# Patient Record
Sex: Male | Born: 1960 | Race: Black or African American | Hispanic: No | Smoking: Current every day smoker
Health system: Southern US, Community
[De-identification: ages and names within clinical notes are randomized; demographics above are authoritative.]

## PROBLEM LIST (undated history)

## (undated) DIAGNOSIS — H9190 Unspecified hearing loss, unspecified ear: Secondary | ICD-10-CM

## (undated) DIAGNOSIS — N200 Calculus of kidney: Secondary | ICD-10-CM

## (undated) DIAGNOSIS — M5430 Sciatica, unspecified side: Secondary | ICD-10-CM

## (undated) DIAGNOSIS — G8929 Other chronic pain: Secondary | ICD-10-CM

## (undated) DIAGNOSIS — J302 Other seasonal allergic rhinitis: Secondary | ICD-10-CM

## (undated) DIAGNOSIS — L309 Dermatitis, unspecified: Secondary | ICD-10-CM

## (undated) DIAGNOSIS — J329 Chronic sinusitis, unspecified: Secondary | ICD-10-CM

## (undated) DIAGNOSIS — Z720 Tobacco use: Secondary | ICD-10-CM

## (undated) DIAGNOSIS — M1990 Unspecified osteoarthritis, unspecified site: Secondary | ICD-10-CM

## (undated) DIAGNOSIS — I10 Essential (primary) hypertension: Secondary | ICD-10-CM

## (undated) DIAGNOSIS — M549 Dorsalgia, unspecified: Secondary | ICD-10-CM

## (undated) DIAGNOSIS — F329 Major depressive disorder, single episode, unspecified: Secondary | ICD-10-CM

## (undated) HISTORY — DX: Unspecified hearing loss, unspecified ear: H91.90

## (undated) HISTORY — PX: INCISION AND DRAINAGE ABSCESS: SHX5864

## (undated) HISTORY — DX: Dermatitis, unspecified: L30.9

## (undated) HISTORY — PX: KIDNEY STONE SURGERY: SHX686

## (undated) HISTORY — DX: Major depressive disorder, single episode, unspecified: F32.9

---

## 1998-08-06 ENCOUNTER — Emergency Department (HOSPITAL_COMMUNITY): Admission: EM | Admit: 1998-08-06 | Discharge: 1998-08-06 | Payer: Self-pay | Admitting: Emergency Medicine

## 1999-02-01 ENCOUNTER — Emergency Department (HOSPITAL_COMMUNITY): Admission: EM | Admit: 1999-02-01 | Discharge: 1999-02-01 | Payer: Self-pay | Admitting: Emergency Medicine

## 1999-11-16 ENCOUNTER — Emergency Department (HOSPITAL_COMMUNITY): Admission: EM | Admit: 1999-11-16 | Discharge: 1999-11-16 | Payer: Self-pay | Admitting: Emergency Medicine

## 1999-12-02 ENCOUNTER — Ambulatory Visit (HOSPITAL_COMMUNITY): Admission: RE | Admit: 1999-12-02 | Discharge: 1999-12-02 | Payer: Self-pay | Admitting: Gastroenterology

## 2000-02-02 ENCOUNTER — Emergency Department (HOSPITAL_COMMUNITY): Admission: EM | Admit: 2000-02-02 | Discharge: 2000-02-02 | Payer: Self-pay | Admitting: Emergency Medicine

## 2000-06-05 ENCOUNTER — Emergency Department (HOSPITAL_COMMUNITY): Admission: EM | Admit: 2000-06-05 | Discharge: 2000-06-05 | Payer: Self-pay | Admitting: Emergency Medicine

## 2000-11-03 ENCOUNTER — Emergency Department (HOSPITAL_COMMUNITY): Admission: EM | Admit: 2000-11-03 | Discharge: 2000-11-03 | Payer: Self-pay | Admitting: *Deleted

## 2001-03-21 ENCOUNTER — Emergency Department (HOSPITAL_COMMUNITY): Admission: EM | Admit: 2001-03-21 | Discharge: 2001-03-21 | Payer: Self-pay | Admitting: Emergency Medicine

## 2001-04-18 ENCOUNTER — Emergency Department (HOSPITAL_COMMUNITY): Admission: EM | Admit: 2001-04-18 | Discharge: 2001-04-18 | Payer: Self-pay | Admitting: Emergency Medicine

## 2001-04-18 ENCOUNTER — Encounter: Payer: Self-pay | Admitting: Emergency Medicine

## 2004-01-02 ENCOUNTER — Emergency Department (HOSPITAL_COMMUNITY): Admission: EM | Admit: 2004-01-02 | Discharge: 2004-01-02 | Payer: Self-pay | Admitting: Emergency Medicine

## 2004-01-18 ENCOUNTER — Emergency Department (HOSPITAL_COMMUNITY): Admission: EM | Admit: 2004-01-18 | Discharge: 2004-01-18 | Payer: Self-pay | Admitting: Emergency Medicine

## 2004-04-08 ENCOUNTER — Emergency Department (HOSPITAL_COMMUNITY): Admission: EM | Admit: 2004-04-08 | Discharge: 2004-04-08 | Payer: Self-pay | Admitting: Emergency Medicine

## 2004-07-27 ENCOUNTER — Emergency Department (HOSPITAL_COMMUNITY): Admission: EM | Admit: 2004-07-27 | Discharge: 2004-07-27 | Payer: Self-pay | Admitting: *Deleted

## 2004-10-16 ENCOUNTER — Emergency Department (HOSPITAL_COMMUNITY): Admission: EM | Admit: 2004-10-16 | Discharge: 2004-10-16 | Payer: Self-pay | Admitting: *Deleted

## 2005-03-27 ENCOUNTER — Emergency Department (HOSPITAL_COMMUNITY): Admission: EM | Admit: 2005-03-27 | Discharge: 2005-03-27 | Payer: Self-pay | Admitting: Emergency Medicine

## 2005-03-29 ENCOUNTER — Emergency Department (HOSPITAL_COMMUNITY): Admission: EM | Admit: 2005-03-29 | Discharge: 2005-03-29 | Payer: Self-pay | Admitting: Family Medicine

## 2005-04-21 ENCOUNTER — Emergency Department (HOSPITAL_COMMUNITY): Admission: EM | Admit: 2005-04-21 | Discharge: 2005-04-21 | Payer: Self-pay | Admitting: *Deleted

## 2005-08-15 ENCOUNTER — Emergency Department (HOSPITAL_COMMUNITY): Admission: EM | Admit: 2005-08-15 | Discharge: 2005-08-15 | Payer: Self-pay | Admitting: *Deleted

## 2005-12-01 ENCOUNTER — Emergency Department (HOSPITAL_COMMUNITY): Admission: EM | Admit: 2005-12-01 | Discharge: 2005-12-01 | Payer: Self-pay | Admitting: Emergency Medicine

## 2005-12-03 ENCOUNTER — Emergency Department (HOSPITAL_COMMUNITY): Admission: EM | Admit: 2005-12-03 | Discharge: 2005-12-03 | Payer: Self-pay | Admitting: Emergency Medicine

## 2006-04-12 ENCOUNTER — Emergency Department (HOSPITAL_COMMUNITY): Admission: EM | Admit: 2006-04-12 | Discharge: 2006-04-12 | Payer: Self-pay | Admitting: Emergency Medicine

## 2006-05-27 ENCOUNTER — Emergency Department (HOSPITAL_COMMUNITY): Admission: EM | Admit: 2006-05-27 | Discharge: 2006-05-27 | Payer: Self-pay | Admitting: Emergency Medicine

## 2006-05-31 ENCOUNTER — Emergency Department (HOSPITAL_COMMUNITY): Admission: EM | Admit: 2006-05-31 | Discharge: 2006-05-31 | Payer: Self-pay | Admitting: Emergency Medicine

## 2007-01-11 ENCOUNTER — Emergency Department (HOSPITAL_COMMUNITY): Admission: EM | Admit: 2007-01-11 | Discharge: 2007-01-11 | Payer: Self-pay | Admitting: Emergency Medicine

## 2007-05-17 ENCOUNTER — Emergency Department (HOSPITAL_COMMUNITY): Admission: EM | Admit: 2007-05-17 | Discharge: 2007-05-17 | Payer: Self-pay | Admitting: Emergency Medicine

## 2007-07-30 ENCOUNTER — Emergency Department (HOSPITAL_COMMUNITY): Admission: EM | Admit: 2007-07-30 | Discharge: 2007-07-30 | Payer: Self-pay | Admitting: *Deleted

## 2008-02-11 ENCOUNTER — Emergency Department (HOSPITAL_COMMUNITY): Admission: EM | Admit: 2008-02-11 | Discharge: 2008-02-11 | Payer: Self-pay | Admitting: Emergency Medicine

## 2008-03-31 ENCOUNTER — Emergency Department (HOSPITAL_COMMUNITY): Admission: EM | Admit: 2008-03-31 | Discharge: 2008-03-31 | Payer: Self-pay | Admitting: Emergency Medicine

## 2008-05-29 IMAGING — CT CT PELVIS W/O CM
2 of 3 series · 17 of 46 positions shown, 19 images · non-contrast
Comparison: Abdominal CT 02/11/2008 done with contrast

CT ABDOMEN

CLINICAL DATA: Postprandial abdominal pain for 1 year, worsening
today.  Rectal bleeding.  Hematuria.  Question kidney stone.

CT ABDOMEN AND PELVIS WITHOUT CONTRAST
TECHNIQUE: Multidetector CT imaging of the abdomen and pelvis was
performed following the standard
protocol without intravenous contrast.

[Series 2: 130 stone 5.0 b40f st · axial · 0.67mm/px · z∈[-485,-185]mm · 14 of 70 slices shown, 16 images]
[im 5/70  soft-tissue]
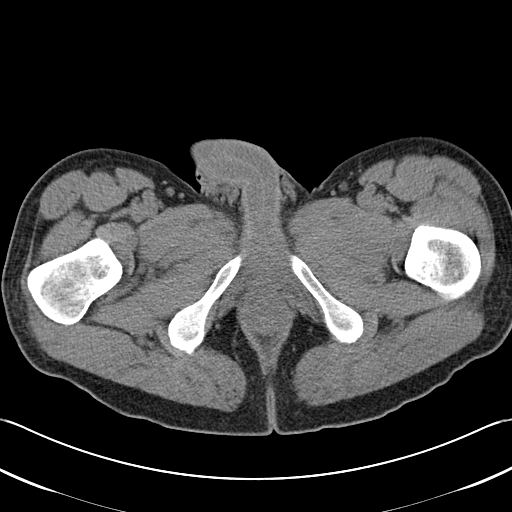
[im 5/70  bone]
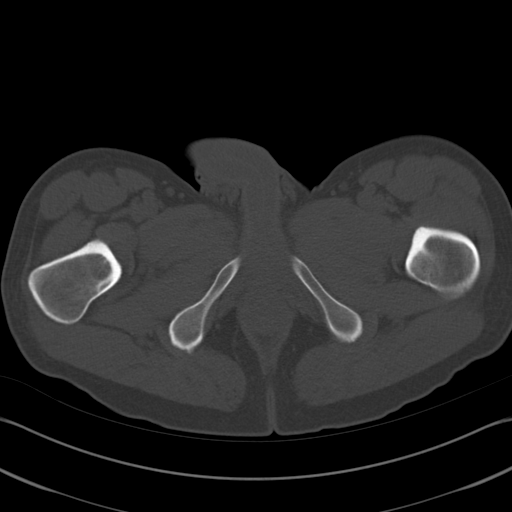
[im 9/70  soft-tissue]
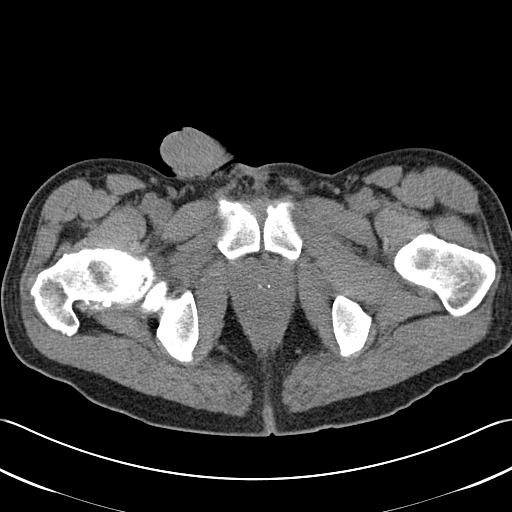
[im 14/70  soft-tissue]
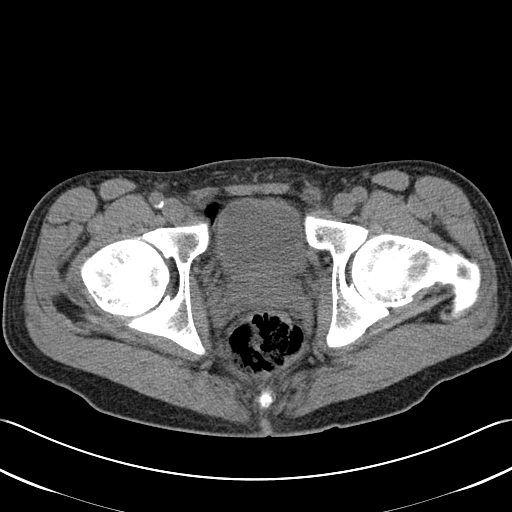
[im 18/70  soft-tissue]
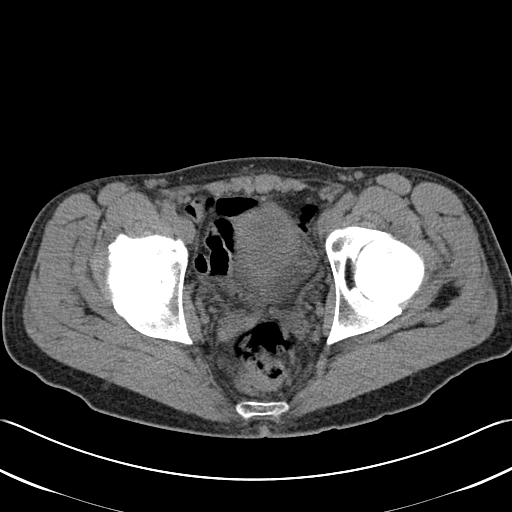
[im 23/70  soft-tissue]
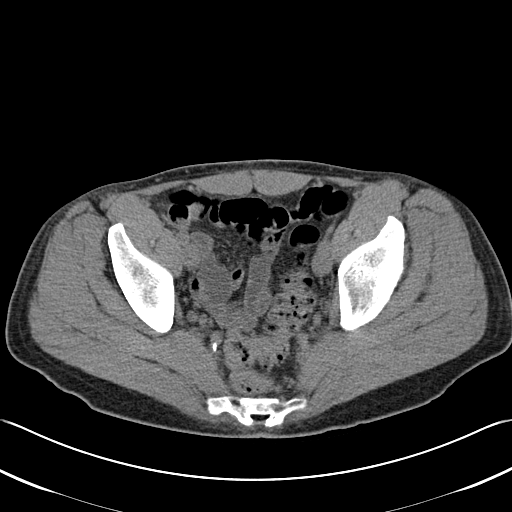
[im 27/70  soft-tissue]
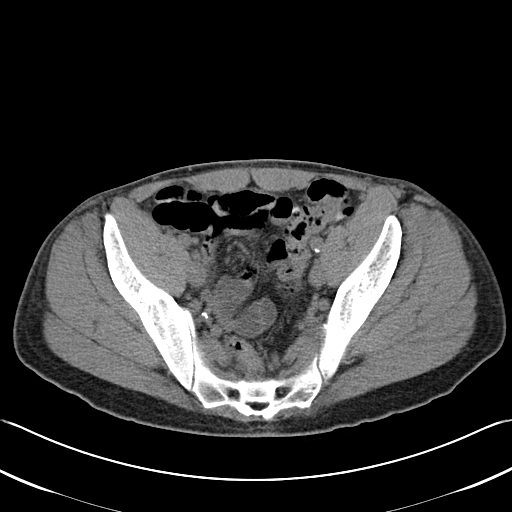
[im 32/70  soft-tissue]
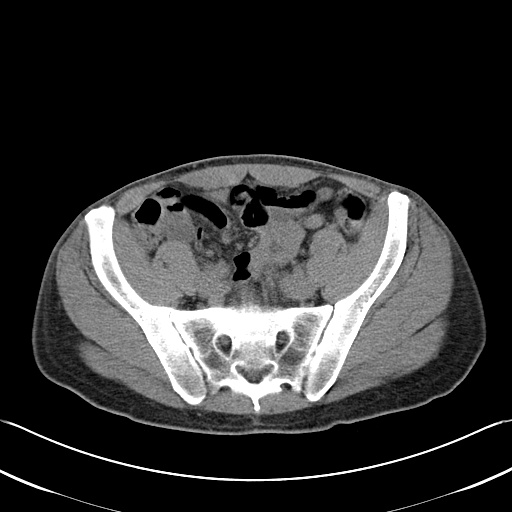
[im 38/70  soft-tissue]
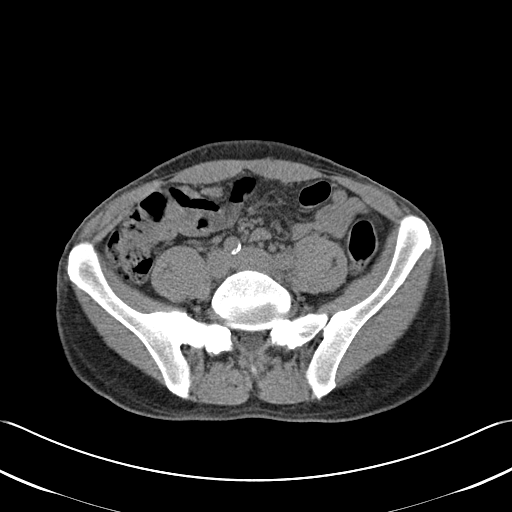
[im 43/70  soft-tissue]
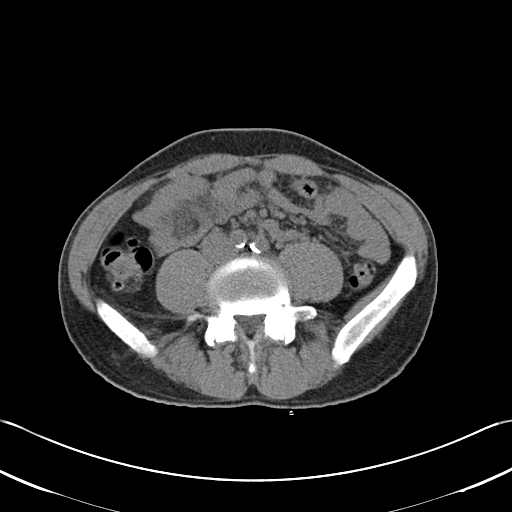
[im 43/70  bone]
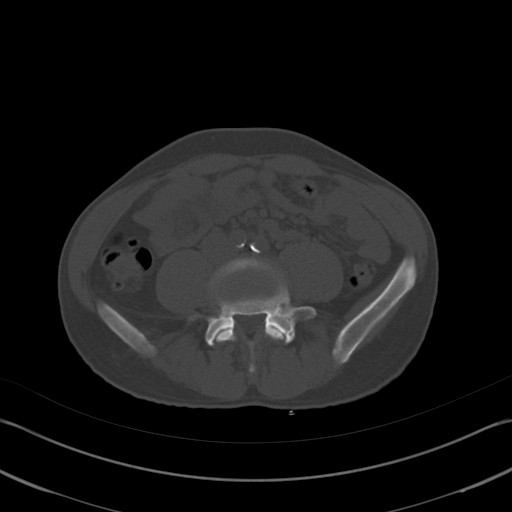
[im 47/70  soft-tissue]
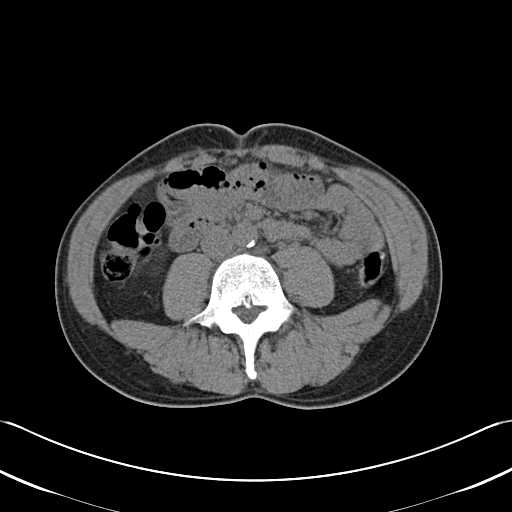
[im 52/70  soft-tissue]
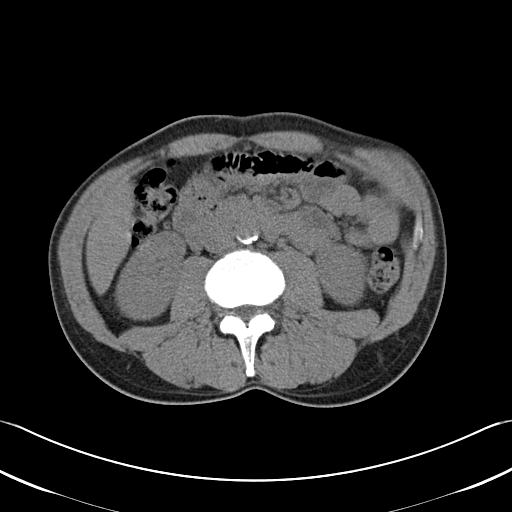
[im 56/70  soft-tissue]
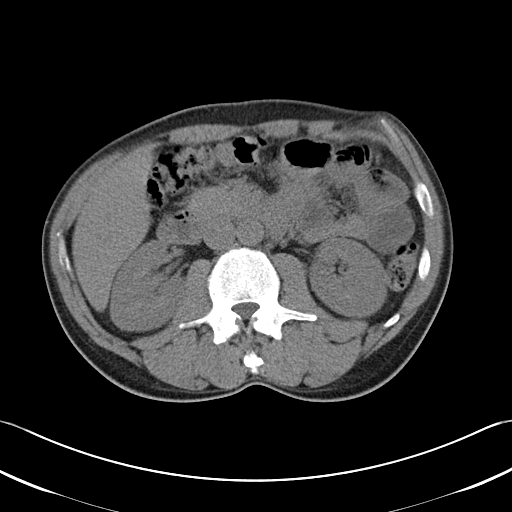
[im 61/70  soft-tissue]
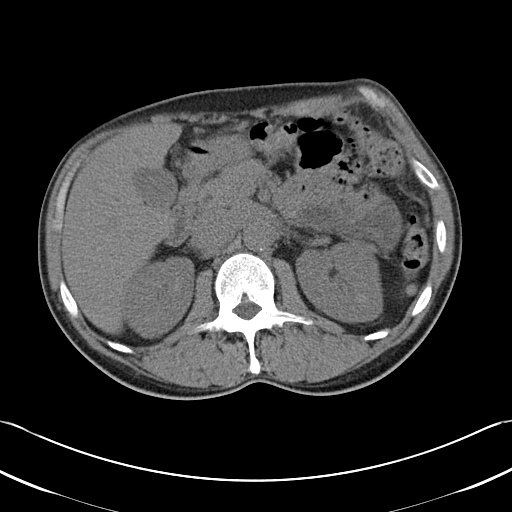
[im 65/70  soft-tissue]
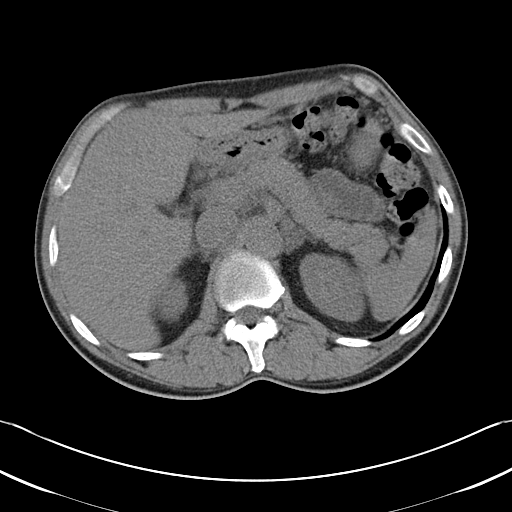

[Series 602: coronal images · coronal · 0.71mm/px · 3 of 82 slices shown]
[im 28/82  soft-tissue]
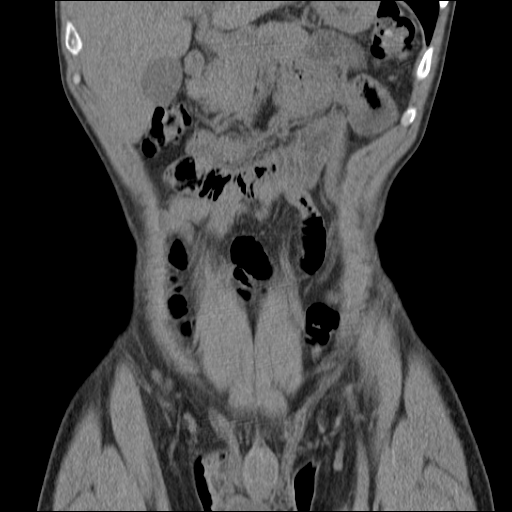
[im 37/82  soft-tissue]
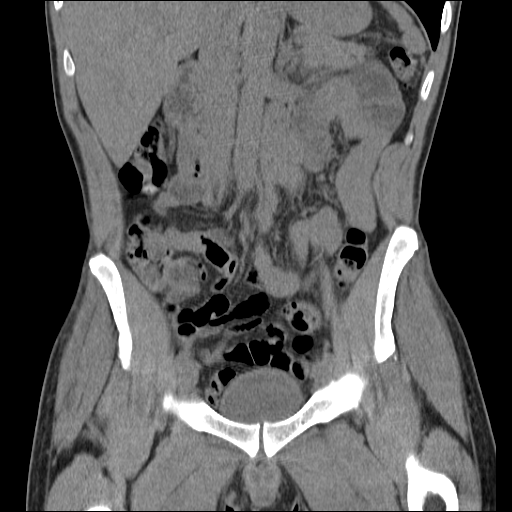
[im 46/82  soft-tissue]
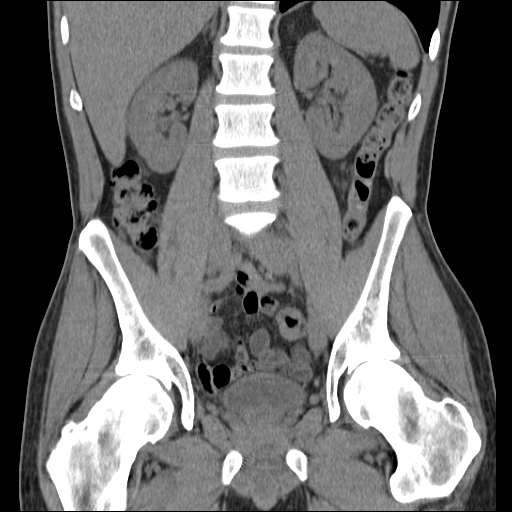

[17 of 46 positions shown; findings below may reference images not displayed]

FINDINGS: As imaged in the unenhanced state, both kidneys appear
normal without calculi, hydronephrosis or perinephric soft tissue
stranding.  No focal renal abnormalities are seen.

Mild low density prominence of the left adrenal gland appears
stable, possibly due to a small adenoma.  The right adrenal gland,
gallbladder and pancreas appear unremarkable.  The visualized liver
and spleen are unchanged from the prior study.  There are small
liver lesions as noted previously.

The bowel gas pattern appears normal.  No inflammatory changes are
evident.  There are early vascular calcifications.
IMPRESSION: 1.  No evidence of urinary tract calculus or hydronephrosis.
2.  No acute abdominal findings are demonstrated.

CT PELVIS
FINDINGS: Distally, the ureters are normal in caliber.  There is
no evidence of ureteral or bladder calculus.  Prostatic
calcifications are noted.  There is a normal caliber air-filled
appendix without surrounding inflammatory change. Sigmoid colon
diverticular changes are present without evidence of associated
inflammation. The sacroiliac joints appear stable.
IMPRESSION: 1.  No evidence of urinary tract calculus or hydronephrosis.
2.  No pelvic inflammatory process evident.

## 2008-06-27 ENCOUNTER — Emergency Department (HOSPITAL_COMMUNITY): Admission: EM | Admit: 2008-06-27 | Discharge: 2008-06-27 | Payer: Self-pay | Admitting: Emergency Medicine

## 2008-07-09 ENCOUNTER — Ambulatory Visit: Payer: Self-pay | Admitting: *Deleted

## 2008-07-09 ENCOUNTER — Ambulatory Visit: Payer: Self-pay | Admitting: Internal Medicine

## 2008-07-09 ENCOUNTER — Encounter (INDEPENDENT_AMBULATORY_CARE_PROVIDER_SITE_OTHER): Payer: Self-pay | Admitting: Family Medicine

## 2008-07-09 LAB — CONVERTED CEMR LAB
ALT: 13 units/L (ref 0–53)
Albumin: 4.8 g/dL (ref 3.5–5.2)
Amphetamine Screen, Ur: NEGATIVE
Basophils Absolute: 0 10*3/uL (ref 0.0–0.1)
Benzodiazepines.: NEGATIVE
CO2: 24 meq/L (ref 19–32)
Chloride: 105 meq/L (ref 96–112)
Cholesterol: 180 mg/dL (ref 0–200)
Eosinophils Relative: 3 % (ref 0–5)
Glucose, Bld: 77 mg/dL (ref 70–99)
Helicobacter Pylori Antibody-IgG: >8 — ABNORMAL HIGH
Lymphocytes Relative: 44 % (ref 12–46)
Lymphs Abs: 1.7 10*3/uL (ref 0.7–4.0)
Methadone: NEGATIVE
Neutro Abs: 1.8 10*3/uL (ref 1.7–7.7)
Neutrophils Relative %: 46 % (ref 43–77)
Phencyclidine (PCP): NEGATIVE
Platelets: 348 10*3/uL (ref 150–400)
Potassium: 4.9 meq/L (ref 3.5–5.3)
Propoxyphene: NEGATIVE
RDW: 14.1 % (ref 11.5–15.5)
Sodium: 141 meq/L (ref 135–145)
Total Bilirubin: 1.3 mg/dL — ABNORMAL HIGH (ref 0.3–1.2)
Total Protein: 7.9 g/dL (ref 6.0–8.3)
VLDL: 12 mg/dL (ref 0–40)
WBC: 3.8 10*3/uL — ABNORMAL LOW (ref 4.0–10.5)

## 2008-07-15 ENCOUNTER — Ambulatory Visit: Payer: Self-pay | Admitting: Internal Medicine

## 2008-07-25 ENCOUNTER — Telehealth: Payer: Self-pay | Admitting: Gastroenterology

## 2008-08-19 ENCOUNTER — Telehealth: Payer: Self-pay | Admitting: Gastroenterology

## 2008-08-27 ENCOUNTER — Ambulatory Visit: Payer: Self-pay | Admitting: Gastroenterology

## 2008-11-17 ENCOUNTER — Ambulatory Visit: Payer: Self-pay | Admitting: Internal Medicine

## 2009-11-11 ENCOUNTER — Encounter (INDEPENDENT_AMBULATORY_CARE_PROVIDER_SITE_OTHER): Payer: Self-pay | Admitting: Family Medicine

## 2009-11-11 ENCOUNTER — Ambulatory Visit: Payer: Self-pay | Admitting: Internal Medicine

## 2009-11-11 DIAGNOSIS — IMO0002 Reserved for concepts with insufficient information to code with codable children: Secondary | ICD-10-CM

## 2009-11-11 DIAGNOSIS — L259 Unspecified contact dermatitis, unspecified cause: Secondary | ICD-10-CM

## 2009-11-11 DIAGNOSIS — D1039 Benign neoplasm of other parts of mouth: Secondary | ICD-10-CM

## 2009-11-11 LAB — CONVERTED CEMR LAB
ALT: 16 units/L (ref 0–53)
Albumin: 4.1 g/dL (ref 3.5–5.2)
CO2: 25 meq/L (ref 19–32)
Calcium: 8.8 mg/dL (ref 8.4–10.5)
Chloride: 111 meq/L (ref 96–112)
Hemoglobin: 14.8 g/dL (ref 13.0–17.0)
Lymphs Abs: 2.1 10*3/uL (ref 0.7–4.0)
MCV: 94.2 fL (ref 78.0–100.0)
Monocytes Relative: 5 % (ref 3–12)
Neutro Abs: 4.9 10*3/uL (ref 1.7–7.7)
Neutrophils Relative %: 65 % (ref 43–77)
RBC: 4.79 M/uL (ref 4.22–5.81)
Sodium: 143 meq/L (ref 135–145)
Total Protein: 7.2 g/dL (ref 6.0–8.3)
WBC: 7.6 10*3/uL (ref 4.0–10.5)

## 2009-11-12 ENCOUNTER — Encounter: Payer: Self-pay | Admitting: Physician Assistant

## 2009-11-12 ENCOUNTER — Ambulatory Visit (HOSPITAL_COMMUNITY): Admission: RE | Admit: 2009-11-12 | Discharge: 2009-11-12 | Payer: Self-pay | Admitting: Family Medicine

## 2009-11-18 ENCOUNTER — Ambulatory Visit: Payer: Self-pay | Admitting: Internal Medicine

## 2010-01-04 ENCOUNTER — Ambulatory Visit: Payer: Self-pay | Admitting: Internal Medicine

## 2010-01-07 ENCOUNTER — Ambulatory Visit (HOSPITAL_COMMUNITY): Admission: RE | Admit: 2010-01-07 | Discharge: 2010-01-07 | Payer: Self-pay | Admitting: Family Medicine

## 2010-01-27 ENCOUNTER — Encounter (INDEPENDENT_AMBULATORY_CARE_PROVIDER_SITE_OTHER): Payer: Self-pay | Admitting: Adult Health

## 2010-01-27 ENCOUNTER — Ambulatory Visit: Payer: Self-pay | Admitting: Internal Medicine

## 2010-01-27 LAB — CONVERTED CEMR LAB
Marijuana Metabolite: POSITIVE — AB
Methadone: NEGATIVE
Opiate Screen, Urine: NEGATIVE
Propoxyphene: NEGATIVE

## 2010-04-23 ENCOUNTER — Ambulatory Visit: Payer: Self-pay | Admitting: Internal Medicine

## 2010-04-23 ENCOUNTER — Encounter (INDEPENDENT_AMBULATORY_CARE_PROVIDER_SITE_OTHER): Payer: Self-pay | Admitting: Adult Health

## 2010-12-07 NOTE — Assessment & Plan Note (Signed)
Summary: Knots under arm and growth in mouth.   History of Present Illness: Knots on fingers, itching, present for about 3 weeks, very itchy/irritating. No h/o scabies. Currently living in the overnight shelter for the last 2 weeks. Problem is recurring for the patient. Previously diagnosed with allergic dermatitis treated with Solumedrol IM, claritin, and triamcinolone cream.  Knots under arms, painful, present 2.5 weeks, has had in the past treated with I&D and Abx. +Fevers and chills mostly at night for about the whole 2.5 weeks.   Mass in mouth. Bleeding with eating, irritating, present since age of 50, has enlarged steadily since then, more recently it has been waxing and waning in size. Recently has become more troublesome and even throbbing/pain. Gained 10 lbs in the last year.  +Tobacco  Brother died of colon CA at age 65.  Review of Systems General:  Complains of chills, fever, and sweats; denies malaise, weakness, and weight loss. ENT:  Denies difficulty swallowing and nasal congestion. Resp:  Denies chest discomfort, cough, shortness of breath, and wheezing. GI:  Denies abdominal pain, bloody stools, and dark tarry stools.  Physical Exam  General:  Well-developed,well-nourished,in no acute distress; alert,appropriate and cooperative throughout examination Head:  Posterior to mandibular incisors, approx 2cm round mass protruding from gums on R, 2 smaller on L. Hard, round, nonmobile. Normal appearing in color Lungs:  Normal respiratory effort, chest expands symmetrically. Lungs are clear to auscultation, no crackles or wheezes. Heart:  Normal rate and regular rhythm. S1 and S2 normal without gallop, murmur, click, rub or other extra sounds. Abdomen:  soft, non-tender, and normal bowel sounds.   Extremities:  R axilla with 4 round firm tender masses with overriding erythema, +induration, no fluctuance.   Impression & Recommendations:  Problem # 1:  DERMATITIS, ALLERGIC  (ICD-692.9)  Triamcinolone cream 0.1% apply two times a day. loratadine 10mg  daily  His updated medication list for this problem includes:    Triamcinolone Acetonide 0.1 % Crea (Triamcinolone acetonide) .Marland Kitchen... Apply to hands two times a day until begins to clear then once daily until improved. do not use longer than 1 month.    Loratadine 10 Mg Tabs (Loratadine) .Marland Kitchen... 1 by mouth daily  Problem # 2:  ABSCESS, AXILLA, RIGHT (ICD-682.3) Warm compresses.  His updated medication list for this problem includes:    Cleocin 300 Mg Caps (Clindamycin hcl) .Marland Kitchen... 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 Panorex. Dental referral.  Complete Medication List: 1)  Triamcinolone Acetonide 0.1 % Crea (Triamcinolone acetonide) .... Apply to hands two times a day until begins to clear then once daily until improved. do not use longer than 1 month. 2)  Loratadine 10 Mg Tabs (Loratadine) .Marland Kitchen.. 1 by mouth daily 3)  Cleocin 300 Mg Caps (Clindamycin hcl) .Marland Kitchen.. 1 by mouth three times a day Prescriptions: TRIAMCINOLONE ACETONIDE 0.1 % CREA (TRIAMCINOLONE ACETONIDE) apply to hands two times a day until begins to clear then once daily until improved. Do not use longer than 1 month.  #large x 0   Entered and Authorized by:   Syliva Overman MD   Signed by:   Syliva Overman MD on 11/11/2009   Method used:   Print then Give to Patient   RxID:   [id number] CLEOCIN 300 MG CAPS (CLINDAMYCIN HCL) 1 by mouth three times a day  #30 x 0   Entered and Authorized by:   Syliva Overman  MD   Signed by:   Syliva Overman MD on 11/11/2009   Method used:   Print then Give to Patient   RxID:   [id number] LORATADINE 10 MG TABS (LORATADINE) 1 by mouth daily  #30 x 5   Entered and Authorized by:   Syliva Overman MD   Signed by:   Syliva Overman MD on 11/11/2009   Method used:   Print then Give to Patient   RxID:   [id number]

## 2011-07-06 ENCOUNTER — Emergency Department (HOSPITAL_COMMUNITY): Payer: Self-pay

## 2011-07-06 ENCOUNTER — Emergency Department (HOSPITAL_COMMUNITY)
Admission: EM | Admit: 2011-07-06 | Discharge: 2011-07-06 | Disposition: A | Discharge status: Home / Self Care | Payer: Self-pay | Attending: Emergency Medicine | Admitting: Emergency Medicine

## 2011-07-06 DIAGNOSIS — S8990XA Unspecified injury of unspecified lower leg, initial encounter: Secondary | ICD-10-CM | POA: Insufficient documentation

## 2011-07-06 DIAGNOSIS — M79609 Pain in unspecified limb: Secondary | ICD-10-CM | POA: Insufficient documentation

## 2011-07-06 DIAGNOSIS — W010XXA Fall on same level from slipping, tripping and stumbling without subsequent striking against object, initial encounter: Secondary | ICD-10-CM | POA: Insufficient documentation

## 2011-07-06 DIAGNOSIS — S9030XA Contusion of unspecified foot, initial encounter: Secondary | ICD-10-CM | POA: Insufficient documentation

## 2011-07-06 DIAGNOSIS — S99919A Unspecified injury of unspecified ankle, initial encounter: Secondary | ICD-10-CM | POA: Insufficient documentation

## 2011-08-02 LAB — COMPREHENSIVE METABOLIC PANEL
Alkaline Phosphatase: 62
BUN: 12
CO2: 29
Chloride: 108
GFR calc non Af Amer: >60
Glucose, Bld: 95
Potassium: 4.3
Total Bilirubin: 1.1
Total Protein: 7

## 2011-08-02 LAB — URINALYSIS, ROUTINE W REFLEX MICROSCOPIC
Glucose, UA: NEGATIVE
Hgb urine dipstick: NEGATIVE
Protein, ur: NEGATIVE

## 2011-08-02 LAB — OCCULT BLOOD X 1 CARD TO LAB, STOOL: Fecal Occult Bld: NEGATIVE

## 2011-08-02 LAB — DIFFERENTIAL
Basophils Absolute: 0.1
Eosinophils Absolute: 0.2
Lymphocytes Relative: 33
Neutrophils Relative %: 56

## 2011-08-02 LAB — LIPASE, BLOOD: Lipase: 20

## 2011-08-02 LAB — CBC
HCT: 44
Hemoglobin: 14.9
RDW: 13.3

## 2011-08-03 LAB — URINALYSIS, ROUTINE W REFLEX MICROSCOPIC
Leukocytes, UA: NEGATIVE
Protein, ur: NEGATIVE
Urobilinogen, UA: 0.2

## 2011-08-03 LAB — CBC
HCT: 42.6
MCHC: 34.1
MCV: 92.9
Platelets: 314

## 2011-08-03 LAB — POCT I-STAT, CHEM 8
Glucose, Bld: 84
HCT: 46
Hemoglobin: 15.6
Potassium: 4.5
Sodium: 141
TCO2: 28

## 2011-08-03 LAB — DIFFERENTIAL
Basophils Relative: 1
Eosinophils Absolute: 0.1
Eosinophils Relative: 2
Monocytes Relative: 6
Neutrophils Relative %: 61

## 2011-08-03 LAB — URINE MICROSCOPIC-ADD ON

## 2011-08-18 LAB — COMPREHENSIVE METABOLIC PANEL
ALT: 18
AST: 22
Albumin: 4.3
Alkaline Phosphatase: 73
Calcium: 9.9
GFR calc Af Amer: >60
Glucose, Bld: 92
Potassium: 4.6
Sodium: 143
Total Protein: 7.5

## 2011-08-18 LAB — CBC
Hemoglobin: 16
MCHC: 33.8
Platelets: 273
RDW: 13.9

## 2011-08-18 LAB — DIFFERENTIAL
Basophils Relative: 1
Eosinophils Absolute: 0.2
Eosinophils Relative: 2
Lymphs Abs: 2.8
Monocytes Absolute: 0.5
Monocytes Relative: 5

## 2011-08-18 LAB — URINALYSIS, ROUTINE W REFLEX MICROSCOPIC
Glucose, UA: NEGATIVE
Hgb urine dipstick: NEGATIVE
Protein, ur: NEGATIVE
pH: 6

## 2011-08-23 LAB — COMPREHENSIVE METABOLIC PANEL
ALT: 14
AST: 16
Alkaline Phosphatase: 73
GFR calc Af Amer: >60
Glucose, Bld: 82
Potassium: 3.9
Sodium: 140
Total Protein: 7.8

## 2011-08-23 LAB — CBC
Hemoglobin: 15.6
RBC: 4.98
RDW: 13.8

## 2011-08-23 LAB — DIFFERENTIAL
Basophils Relative: 1
Eosinophils Absolute: 0.1
Eosinophils Relative: 2
Lymphs Abs: 2.5
Monocytes Absolute: 0.5
Monocytes Relative: 8
Neutrophils Relative %: 47

## 2012-03-09 ENCOUNTER — Emergency Department (HOSPITAL_COMMUNITY): Payer: No Typology Code available for payment source

## 2012-03-09 ENCOUNTER — Emergency Department (HOSPITAL_COMMUNITY)
Admission: EM | Admit: 2012-03-09 | Discharge: 2012-03-09 | Disposition: A | Discharge status: Home / Self Care | Payer: No Typology Code available for payment source | Attending: Emergency Medicine | Admitting: Emergency Medicine

## 2012-03-09 ENCOUNTER — Encounter (HOSPITAL_COMMUNITY): Payer: Self-pay | Admitting: Emergency Medicine

## 2012-03-09 DIAGNOSIS — M542 Cervicalgia: Secondary | ICD-10-CM | POA: Insufficient documentation

## 2012-03-09 DIAGNOSIS — S39012A Strain of muscle, fascia and tendon of lower back, initial encounter: Secondary | ICD-10-CM

## 2012-03-09 DIAGNOSIS — S161XXA Strain of muscle, fascia and tendon at neck level, initial encounter: Secondary | ICD-10-CM

## 2012-03-09 DIAGNOSIS — IMO0002 Reserved for concepts with insufficient information to code with codable children: Secondary | ICD-10-CM

## 2012-03-09 DIAGNOSIS — S239XXA Sprain of unspecified parts of thorax, initial encounter: Secondary | ICD-10-CM | POA: Insufficient documentation

## 2012-03-09 DIAGNOSIS — M549 Dorsalgia, unspecified: Secondary | ICD-10-CM | POA: Insufficient documentation

## 2012-03-09 DIAGNOSIS — S139XXA Sprain of joints and ligaments of unspecified parts of neck, initial encounter: Secondary | ICD-10-CM | POA: Insufficient documentation

## 2012-03-09 DIAGNOSIS — S335XXA Sprain of ligaments of lumbar spine, initial encounter: Secondary | ICD-10-CM | POA: Insufficient documentation

## 2012-03-09 MED ORDER — HYDROCODONE-ACETAMINOPHEN 5-325 MG PO TABS: 2.0000 | ORAL_TABLET | Freq: Four times a day (QID) | ORAL | Status: AC | PRN

## 2012-03-09 NOTE — ED Notes (Signed)
Went into introduce myself but patient gone to Xray.

## 2012-03-09 NOTE — ED Notes (Signed)
Pt to radiology with tech
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
## 2012-03-09 NOTE — Discharge Instructions (Signed)
SEEK IMMEDIATE MEDICAL ATTENTION IF: New numbness, tingling, weakness, or problem with the use of your arms or legs.  Severe back pain not relieved with medications.  Change in bowel or bladder control.  Increasing pain in any areas of the body (such as chest or abdominal pain).  Shortness of breath, dizziness or fainting.  Nausea (feeling sick to your stomach), vomiting, fever, or sweats.  You have neck pain, possibly from a cervical strain and/or pinched nerve.  SEEK IMMEDIATE MEDICAL ATTENTION IF: You develop difficulties swallowing or breathing.  You have new or worse numbness, weakness, tingling, or movement problems in your arms or legs.  You develop increasing pain which is uncontrolled with medications.  You have change in bowel or bladder function, or other concerns.  RESOURCE GUIDE  Dental Problems  Patients with Medicaid: [facility] [phone] [address]                                           [phone] [address] Phone:  [phone]                                                  Phone:  [phone]  If unable to pay or uninsured, contact:  [facility] or [facility]. to become qualified for the adult dental clinic.  Chronic Pain Problems Contact [facility]  [phone] Patients need to be referred by their primary care doctor.  Insufficient Money for Medicine Contact [employer]:  call "211" or [facility] [phone].  No Primary Care Doctor Call [facility]  [phone] Other agencies that provide inexpensive medical care    [facility]  [phone]    [facility]  [phone]    [facility]  [phone]    [facility]  [phone]    [facility]  [phone]    [facility]  [phone]  Psychological Services [facility]  [phone] [facility]  [phone] [facility]   [phone] (emergency  services [phone])  Substance Abuse Resources [facility]  [phone] [facility] [phone] [facility] [phone] Floydene Flock [phone] Residential & Outpatient Substance Abuse Program  [phone]  Abuse/Neglect [facility] Child Abuse Hotline 4132306629 [facility] Child Abuse Hotline 7163097692 (After Hours)  Emergency Shelter [facility] [phone]  [facility] [facility] [phone] [facility] [phone]  MRSA Hotline #:   6171287145    [facility] Resources  [facility] [place]     [employer]                          [facility] Dept. [address] [place]                       [address]      [address]  [address]  Cristobal Goldmann Phone:  [phone]                                   Phone:  [phone]                 Phone:  [phone]  [facility] Phone:  [phone]  [facility] Child Abuse Hotline [phone] (After Hours)
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
## 2012-08-30 ENCOUNTER — Encounter (HOSPITAL_COMMUNITY): Payer: Self-pay | Admitting: *Deleted

## 2012-08-30 ENCOUNTER — Emergency Department (HOSPITAL_COMMUNITY)
Admission: EM | Admit: 2012-08-30 | Discharge: 2012-08-30 | Disposition: A | Discharge status: Home Health | Payer: Self-pay | Attending: Emergency Medicine | Admitting: Emergency Medicine

## 2012-08-30 ENCOUNTER — Emergency Department (HOSPITAL_COMMUNITY): Payer: Self-pay

## 2012-08-30 DIAGNOSIS — J9801 Acute bronchospasm: Secondary | ICD-10-CM | POA: Insufficient documentation

## 2012-08-30 DIAGNOSIS — F17200 Nicotine dependence, unspecified, uncomplicated: Secondary | ICD-10-CM | POA: Insufficient documentation

## 2012-08-30 DIAGNOSIS — J209 Acute bronchitis, unspecified: Secondary | ICD-10-CM | POA: Insufficient documentation

## 2012-08-30 DIAGNOSIS — Z720 Tobacco use: Secondary | ICD-10-CM

## 2012-08-30 DIAGNOSIS — M79609 Pain in unspecified limb: Secondary | ICD-10-CM | POA: Insufficient documentation

## 2012-08-30 HISTORY — DX: Other seasonal allergic rhinitis: J30.2

## 2012-08-30 MED ORDER — BENZONATATE 100 MG PO CAPS: 200.0000 mg | ORAL_CAPSULE | Freq: Two times a day (BID) | ORAL | Status: DC | PRN

## 2012-08-30 MED ORDER — PREDNISONE 20 MG PO TABS: 40.0000 mg | ORAL_TABLET | Freq: Every day | ORAL | Status: DC

## 2012-08-30 MED ORDER — ALBUTEROL SULFATE HFA 108 (90 BASE) MCG/ACT IN AERS
2.0000 | INHALATION_SPRAY | Freq: Once | RESPIRATORY_TRACT | Status: AC
  Administered 2012-08-30: 2 via RESPIRATORY_TRACT
  Filled 2012-08-30: qty 6.7

## 2012-08-30 NOTE — ED Notes (Signed)
Patient states cold symptoms, (cough, weakness) x 1 week, patient also with c/o arms and legs tingling and poor appetite

## 2012-08-30 NOTE — ED Provider Notes (Signed)
History     CSN: [id number]  Arrival date & time 08/30/12  3086   First MD Initiated Contact with Patient 08/30/12 0818      Chief Complaint  Patient presents with  . Extremity Pain    arms/legs  . URI    (Consider location/radiation/quality/duration/timing/severity/associated sxs/prior treatment) HPI Comments: Mr. Francom presents for evaluation of a persistent cough as well as numbness in his left leg. He reports he's had a numbness in his left leg for over 4 months. He states sometimes it is just the numbness sometimes it's a tingling sensation but it is been fairly constant during this time. He denies one-sided weakness, difficulty forming his words, difficulty swallowing, and headaches. He does state that he has had lower back pain for so long he cannot remember when he didn't have the discomfort. He denies difficulty walking upstairs, urinating, or numbness on his bottom or perineum. He reports that over the last 2 weeks he's developed a cough. He states that sometimes he coughs so violently that it hurts his chest and will cause him to vomit. He states is productive of thick sputum. He denies however having any fevers, night sweats, weight loss, shortness of breath while at rest, orthopnea, or exertional dyspnea. He denies any leg swelling or calf pain also and has had no recent travel.  Patient is a 51 y.o. male presenting with cough. The history is provided by the patient.  Cough This is a new problem. The current episode started more than 1 week ago. The problem occurs every few minutes. The problem has not changed since onset.The cough is productive of sputum. There has been no fever. Associated symptoms include chest pain (Only with cough), rhinorrhea and shortness of breath (only with the cough). Pertinent negatives include no chills, no sweats, no ear congestion, no ear pain, no headaches, no sore throat, no myalgias, no wheezing and no eye redness. He has tried nothing for the  symptoms. He is a smoker.    Past Medical History  Diagnosis Date  . Seasonal allergies     History reviewed. No pertinent past surgical history.  No family history on file.  History  Substance Use Topics  . Smoking status: Current Every Day Smoker  . Smokeless tobacco: Not on file  . Alcohol Use: Yes      Review of Systems  Constitutional: Negative for chills.  HENT: Positive for rhinorrhea. Negative for ear pain and sore throat.   Eyes: Negative for redness.  Respiratory: Positive for cough and shortness of breath (only with the cough). Negative for choking and wheezing.   Cardiovascular: Positive for chest pain (Only with cough).  Musculoskeletal: Positive for back pain and arthralgias. Negative for myalgias and gait problem.  Skin: Negative.   Neurological: Negative for headaches.  Psychiatric/Behavioral: Negative.     Allergies  Review of patient's allergies indicates no known allergies.  Home Medications   Current Outpatient Rx  Name Route Sig Dispense Refill  . ALKA-SELTZER PLUS COLD & COUGH PO Oral Take 1 packet by mouth daily as needed. For cough    . NYQUIL PO Oral Take 2 capsules by mouth daily as needed. For cough      BP 164/99  Temp 97.7 F (36.5 C) (Oral)  Resp 20  SpO2 98%  Physical Exam  Nursing note and vitals reviewed. Constitutional: He is oriented to person, place, and time. He appears well-developed and well-nourished. No distress.  HENT:  Head: Normocephalic and atraumatic.  Right Ear:  External ear normal.  Left Ear: External ear normal.  Nose: Nose normal.  Mouth/Throat: Oropharynx is clear and moist. No oropharyngeal exudate.  Eyes: Conjunctivae normal are normal. Pupils are equal, round, and reactive to light. Right eye exhibits no discharge. Left eye exhibits no discharge. No scleral icterus.  Neck: Normal range of motion. Neck supple. No JVD present. No tracheal deviation present.  Cardiovascular: Normal rate, regular rhythm,  normal heart sounds and intact distal pulses.  Exam reveals no gallop.   No murmur heard. Pulmonary/Chest: Effort normal. No stridor. No respiratory distress (Breath sounds are coarse without increased work of breathing or specific wheezes rales or rhonchi.). He has no wheezes. He has no rales. He exhibits tenderness.  Abdominal: Soft. Bowel sounds are normal. He exhibits no distension and no mass. There is no tenderness. There is no rebound and no guarding.  Musculoskeletal: Normal range of motion. He exhibits tenderness (vague lower back tenderness without midline pointe tenderness). He exhibits no edema.  Lymphadenopathy:    He has no cervical adenopathy.  Neurological: He is alert and oriented to person, place, and time.  Skin: Skin is warm and dry. No rash noted. He is not diaphoretic. No erythema. No pallor.  Psychiatric: He has a normal mood and affect. His behavior is normal.    ED Course  Procedures (including critical care time)  Labs Reviewed - No data to display No results found.   No diagnosis found.    MDM  Pt presents for evaluation of left leg numbness and a productive cough. The leg numbness has been ongoing for greater than 4 months. On followup history he reports having significant chronic lower back pain. He denies saddle anesthesia or difficulty urinating. He has no focal deficits on physical exam. I have encouraged outpatient followup regarding this issue. Over last 2 weeks he is also had a productive cough. He denies having any fever. Currently no stable vital signs. He has no respiratory insufficiency or distress on exam. He is a smoker and I suspect his symptoms are related to an acute or subacute bronchitis. We'll obtain a chest x-ray and administer albuterol. Will reassess.  1030.  Pt stable, NAD.  No evidence of PNA on CXR.  Plan treat for bronchitis.  Will refer for outpt follow-up.        Tobin Chad, MD 08/30/12 1031

## 2013-06-03 ENCOUNTER — Encounter (HOSPITAL_COMMUNITY): Payer: Self-pay | Admitting: Cardiology

## 2013-06-03 ENCOUNTER — Emergency Department (HOSPITAL_COMMUNITY)
Admission: EM | Admit: 2013-06-03 | Discharge: 2013-06-03 | Disposition: A | Discharge status: Home / Self Care | Payer: Self-pay | Attending: Emergency Medicine | Admitting: Emergency Medicine

## 2013-06-03 DIAGNOSIS — K029 Dental caries, unspecified: Secondary | ICD-10-CM | POA: Insufficient documentation

## 2013-06-03 DIAGNOSIS — R6884 Jaw pain: Secondary | ICD-10-CM | POA: Insufficient documentation

## 2013-06-03 DIAGNOSIS — K047 Periapical abscess without sinus: Secondary | ICD-10-CM | POA: Insufficient documentation

## 2013-06-03 DIAGNOSIS — K0889 Other specified disorders of teeth and supporting structures: Secondary | ICD-10-CM

## 2013-06-03 DIAGNOSIS — J309 Allergic rhinitis, unspecified: Secondary | ICD-10-CM | POA: Insufficient documentation

## 2013-06-03 DIAGNOSIS — Z79899 Other long term (current) drug therapy: Secondary | ICD-10-CM | POA: Insufficient documentation

## 2013-06-03 DIAGNOSIS — F17200 Nicotine dependence, unspecified, uncomplicated: Secondary | ICD-10-CM | POA: Insufficient documentation

## 2013-06-03 DIAGNOSIS — I10 Essential (primary) hypertension: Secondary | ICD-10-CM | POA: Insufficient documentation

## 2013-06-03 HISTORY — DX: Essential (primary) hypertension: I10

## 2013-06-03 MED ORDER — AMOXICILLIN 500 MG PO CAPS: 500.0000 mg | ORAL_CAPSULE | Freq: Three times a day (TID) | ORAL | Status: DC

## 2013-06-03 MED ORDER — NAPROXEN 500 MG PO TABS: 500.0000 mg | ORAL_TABLET | Freq: Two times a day (BID) | ORAL | Status: DC

## 2013-06-03 NOTE — ED Notes (Signed)
Pt reports dental pain that started on Friday. States that pain is worse with movement. Reports he has false teeth in the top front.

## 2013-06-03 NOTE — ED Provider Notes (Signed)
CSN: [id number]     Arrival date & time 06/03/13  1255 History     First MD Initiated Contact with Patient 06/03/13 1337     Chief Complaint  Patient presents with  . Dental Pain   (Consider location/radiation/quality/duration/timing/severity/associated sxs/prior Treatment) HPI Comments: 52 year old male presents to the emergency department complaining of dental pain for the past 4 days. Patient states while he was at work on Friday he began having lower jaw pain bilateral, worse on the right. Pain described as throbbing, rated 8/10. He has not tried any alleviating factors, pain worsened with eating. Denies fever, chills, difficulty swallowing. He does not have a dentist.  Patient is a 52 y.o. male presenting with tooth pain. The history is provided by the patient.  Dental Pain Associated symptoms: no facial swelling and no fever     Past Medical History  Diagnosis Date  . Seasonal allergies   . Hypertension    History reviewed. No pertinent past surgical history. History reviewed. No pertinent family history. History  Substance Use Topics  . Smoking status: Current Every Day Smoker    Types: Cigarettes  . Smokeless tobacco: Not on file  . Alcohol Use: Yes    Review of Systems  Constitutional: Negative for fever and chills.  HENT: Positive for dental problem. Negative for facial swelling and trouble swallowing.   All other systems reviewed and are negative.    Allergies  Review of patient's allergies indicates no known allergies.  Home Medications   Current Outpatient Rx  Name  Route  Sig  Dispense  Refill  . amoxicillin (AMOXIL) 500 MG capsule   Oral   Take 1 capsule (500 mg total) by mouth 3 (three) times daily.   21 capsule   0   . benzonatate (TESSALON) 100 MG capsule   Oral   Take 2 capsules (200 mg total) by mouth 2 (two) times daily as needed for cough.   20 capsule   0   . naproxen (NAPROSYN) 500 MG tablet   Oral   Take 1 tablet (500 mg total) by  mouth 2 (two) times daily.   30 tablet   0   . Phenyleph-CPM-DM-Aspirin (ALKA-SELTZER PLUS COLD & COUGH PO)   Oral   Take 1 packet by mouth daily as needed. For cough         . predniSONE (DELTASONE) 20 MG tablet   Oral   Take 2 tablets (40 mg total) by mouth daily. Take 40 mg by mouth daily for 3 days, then 20mg  by mouth daily for 3 days, then 10mg  daily for 3 days   12 tablet   0   . Pseudoeph-Doxylamine-DM-APAP (NYQUIL PO)   Oral   Take 2 capsules by mouth daily as needed. For cough          BP 144/105  Pulse 61  Temp(Src) 97.8 F (36.6 C) (Oral)  Resp 16  SpO2 99% Physical Exam  Nursing note and vitals reviewed. Constitutional: He is oriented to person, place, and time. He appears well-developed and well-nourished. No distress.  HENT:  Head: Normocephalic and atraumatic.  Mouth/Throat: Uvula is midline and oropharynx is clear and moist. Abnormal dentition. Dental caries present.    Poor dentition throughout lower teeth, upper teeth dentures. Multiple caries and missing teeth.  Eyes: Conjunctivae are normal.  Neck: Normal range of motion. Neck supple.  Cardiovascular: Normal rate, regular rhythm and normal heart sounds.   Pulmonary/Chest: Effort normal and breath sounds normal.  Musculoskeletal: Normal  range of motion. He exhibits no edema.  Lymphadenopathy:    He has no cervical adenopathy.  Neurological: He is alert and oriented to person, place, and time.  Skin: Skin is warm and dry. He is not diaphoretic.  Psychiatric: He has a normal mood and affect. His behavior is normal.    ED Course   Procedures (including critical care time)  Labs Reviewed - No data to display No results found. 1. Dental infection   2. Pain, dental     MDM   Dental pain associated with dental infection. No evidence of dental abscess. Patient is afebrile, non toxic appearing and swallowing secretions well. I gave patient referral to dentist and stressed the importance of dental  follow up for ultimate management of dental pain. I will also give amoxicillin and pain control. Patient voices understanding and is agreeable to plan.   Trevor Mace, PA-C 06/03/13 1346

## 2013-06-03 NOTE — ED Provider Notes (Signed)
  Medical screening examination/treatment/procedure(s) were performed by non-physician practitioner and as supervising physician I was immediately available for consultation/collaboration.    Macklyn Glandon, MD 06/03/13 1435 

## 2013-10-21 ENCOUNTER — Emergency Department (HOSPITAL_COMMUNITY)
Admission: EM | Admit: 2013-10-21 | Discharge: 2013-10-21 | Disposition: A | Discharge status: Home / Self Care | Payer: No Typology Code available for payment source | Attending: Emergency Medicine | Admitting: Emergency Medicine

## 2013-10-21 ENCOUNTER — Encounter (HOSPITAL_COMMUNITY): Payer: Self-pay | Admitting: Emergency Medicine

## 2013-10-21 DIAGNOSIS — M25512 Pain in left shoulder: Secondary | ICD-10-CM

## 2013-10-21 DIAGNOSIS — G8929 Other chronic pain: Secondary | ICD-10-CM | POA: Insufficient documentation

## 2013-10-21 DIAGNOSIS — M25519 Pain in unspecified shoulder: Secondary | ICD-10-CM | POA: Insufficient documentation

## 2013-10-21 DIAGNOSIS — R209 Unspecified disturbances of skin sensation: Secondary | ICD-10-CM | POA: Insufficient documentation

## 2013-10-21 DIAGNOSIS — M26629 Arthralgia of temporomandibular joint, unspecified side: Secondary | ICD-10-CM | POA: Insufficient documentation

## 2013-10-21 DIAGNOSIS — M5430 Sciatica, unspecified side: Secondary | ICD-10-CM | POA: Insufficient documentation

## 2013-10-21 DIAGNOSIS — M545 Low back pain, unspecified: Secondary | ICD-10-CM | POA: Insufficient documentation

## 2013-10-21 DIAGNOSIS — K089 Disorder of teeth and supporting structures, unspecified: Secondary | ICD-10-CM | POA: Insufficient documentation

## 2013-10-21 DIAGNOSIS — H9209 Otalgia, unspecified ear: Secondary | ICD-10-CM | POA: Insufficient documentation

## 2013-10-21 DIAGNOSIS — F17200 Nicotine dependence, unspecified, uncomplicated: Secondary | ICD-10-CM | POA: Insufficient documentation

## 2013-10-21 DIAGNOSIS — Z791 Long term (current) use of non-steroidal anti-inflammatories (NSAID): Secondary | ICD-10-CM | POA: Insufficient documentation

## 2013-10-21 DIAGNOSIS — I10 Essential (primary) hypertension: Secondary | ICD-10-CM | POA: Insufficient documentation

## 2013-10-21 HISTORY — DX: Dorsalgia, unspecified: M54.9

## 2013-10-21 HISTORY — DX: Other chronic pain: G89.29

## 2013-10-21 HISTORY — DX: Sciatica, unspecified side: M54.30

## 2013-10-21 MED ORDER — ACETAMINOPHEN 500 MG PO TABS: 1000.0000 mg | ORAL_TABLET | Freq: Three times a day (TID) | ORAL | Status: DC | PRN

## 2013-10-21 MED ORDER — NAPROXEN 500 MG PO TABS: 500.0000 mg | ORAL_TABLET | Freq: Two times a day (BID) | ORAL | Status: DC

## 2013-10-21 MED ORDER — HYDROCODONE-ACETAMINOPHEN 5-325 MG PO TABS
2.0000 | ORAL_TABLET | Freq: Once | ORAL | Status: AC
  Administered 2013-10-21: 2 via ORAL
  Filled 2013-10-21: qty 2

## 2013-10-21 MED ORDER — NAPROXEN 250 MG PO TABS
500.0000 mg | ORAL_TABLET | Freq: Once | ORAL | Status: AC
  Administered 2013-10-21: 500 mg via ORAL
  Filled 2013-10-21: qty 2

## 2013-10-21 NOTE — ED Notes (Signed)
Pt c/o bilateral jaw pain x1 wk and left arm and left leg numbness and pain. Pt states the pain in his arm has been there for a week, the leg pain has been longer. Pt rates pain 10/10. Pt has HTN, but has not been taking medications x 3mos.

## 2013-10-21 NOTE — ED Notes (Signed)
Pt denies any cp 

## 2013-10-21 NOTE — ED Provider Notes (Signed)
CSN: [id number]     Arrival date & time 10/21/13  1146 History   First MD Initiated Contact with Patient 10/21/13 1215     Chief Complaint  Patient presents with  . Dental Pain  . Arm Pain   (Consider location/radiation/quality/duration/timing/severity/associated sxs/prior Treatment) HPI Comments: 52 yo male with hx of chronic back pain with sciatica and recurrent dental pain presents with c/o left sided lower back pain radiating to the left leg, left shoulder pain that radiates down the arm, as well as pain to the bilateral TMJ x 2 weeks.   Back/leg pain worse with certain positions and improved transiently with OTC pain meds. Denies urinary/bowel incontinence, abd pain, weakness. Endorses baseline left leg intermittent numbness/tingling.  Jaw pain has been present for about 2 weeks, worse with exposure to cold air (reports cool air from vent blows on him at work), not improved by anything and accompanied by right ear pain. Denies rhinorrhea, sore throat, dental abscess, fever, chills.  Left shoulder pain is intermittent, no aggravating nor alleviating factors. Denies numbness/tingling, weakness of upper extremity.  The history is provided by the patient.    Past Medical History  Diagnosis Date  . Seasonal allergies   . Hypertension   . Chronic back pain   . Sciatica    History reviewed. No pertinent past surgical history. No family history on file. History  Substance Use Topics  . Smoking status: Current Every Day Smoker    Types: Cigarettes  . Smokeless tobacco: Not on file  . Alcohol Use: Yes    Review of Systems  Constitutional: Negative for fever.  HENT: Positive for dental problem and ear pain. Negative for facial swelling, rhinorrhea and sore throat.   Respiratory: Negative for cough and shortness of breath.   Cardiovascular: Negative for chest pain, palpitations and leg swelling.  Gastrointestinal: Negative for abdominal pain.  Genitourinary: Negative for  difficulty urinating.  Musculoskeletal: Positive for back pain. Negative for gait problem.  Skin: Negative for rash.  Neurological: Positive for numbness. Negative for weakness.  Hematological: Negative for adenopathy.  Psychiatric/Behavioral: Negative for agitation.    Allergies  Review of patient's allergies indicates no known allergies.  Home Medications   Current Outpatient Rx  Name  Route  Sig  Dispense  Refill  . acetaminophen (TYLENOL) 500 MG tablet   Oral   Take 2 tablets (1,000 mg total) by mouth every 8 (eight) hours as needed for mild pain.   30 tablet   0   . naproxen (NAPROSYN) 500 MG tablet   Oral   Take 1 tablet (500 mg total) by mouth 2 (two) times daily.   30 tablet   0   . naproxen (NAPROSYN) 500 MG tablet   Oral   Take 1 tablet (500 mg total) by mouth 2 (two) times daily.   30 tablet   0    BP 165/110  Pulse 83  Temp(Src) 98 F (36.7 C)  Resp 10  SpO2 99% Physical Exam  Nursing note and vitals reviewed. Constitutional: He is oriented to person, place, and time. He appears well-developed and well-nourished.  HENT:  Head: Normocephalic and atraumatic.  Right Ear: Tympanic membrane, external ear and ear canal normal.  Left Ear: Tympanic membrane, external ear and ear canal normal.  Nose: Nose normal.  Mouth/Throat: Oropharynx is clear and moist and mucous membranes are normal. No trismus in the jaw. Abnormal dentition. No dental abscesses.    No evidence of fluid collection/abscess. Generalized moderate to severe  gingivitis and mild abnormal dentition. Mild tenderness bilateral TMJ, FROM of mandible, no popping/clicking.  Eyes: EOM are normal.  Neck: Normal range of motion and full passive range of motion without pain. Neck supple. No spinous process tenderness and no muscular tenderness present.  Cardiovascular: Normal rate, regular rhythm, normal heart sounds and intact distal pulses.   Pulmonary/Chest: Effort normal and breath sounds normal.  He exhibits no tenderness.  Abdominal: Soft. There is no tenderness.  Musculoskeletal:       Left shoulder: He exhibits tenderness ( anterior bursa) and pain. He exhibits normal range of motion, no bony tenderness, no swelling, no effusion, no crepitus, no deformity, no spasm, normal pulse and normal strength.       Lumbar back: He exhibits decreased range of motion ( slight), tenderness and pain. He exhibits no bony tenderness, no swelling, no edema, no deformity, no spasm and normal pulse.       Back:  Neurological: He is alert and oriented to person, place, and time. He has normal strength and normal reflexes. No sensory deficit.  Skin: Skin is warm and dry.  Psychiatric: He has a normal mood and affect.    ED Course  Procedures (including critical care time) Labs Review Labs Reviewed - No data to display Imaging Review No results found.  EKG Interpretation   None       MDM   1. Low back pain with sciatica, left   2. TMJ arthralgia   3. Left shoulder pain    52 yo male with multiple complaints. Neuro exam reveals no deficits, no report of bowel/bladder incontinence, no evidence to suggest cauda equina, no emergent spine imaging indicated--likely exacerbation of chronic low back pain with sciatica. Left shoulder with FROM, no bony TTP and no evidence of infection. Neurovascularly intact distally--doubt septic joint, no imaging indicated. No evidence of ear infection. 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 Will recommend NSAIDs and pain control and f/u with a PCP. Strict return instructions discussed and provided to the patient in writing at time of d/c.       Simmie Davies, NP 10/21/13 1259

## 2013-10-21 NOTE — ED Notes (Signed)
Pt states he was taking the bus home. Discharge instructions reviewed with pt. Pt verbalized understanding.

## 2013-10-21 NOTE — ED Notes (Addendum)
Bilateral jaw pain and left arm pain x 1 week has been nauseated has not taken bp meds x 2 months did not think he needed it

## 2013-10-23 NOTE — ED Provider Notes (Signed)
Medical screening examination/treatment/procedure(s) were performed by non-physician practitioner and as supervising physician I was immediately available for consultation/collaboration.  EKG Interpretation   None         Laray Anger, DO 10/23/13 [phone]

## 2013-11-07 DIAGNOSIS — I10 Essential (primary) hypertension: Secondary | ICD-10-CM

## 2013-11-07 HISTORY — DX: Essential (primary) hypertension: I10

## 2014-01-01 ENCOUNTER — Emergency Department (HOSPITAL_COMMUNITY)
Admission: EM | Admit: 2014-01-01 | Discharge: 2014-01-01 | Disposition: A | Discharge status: Home / Self Care | Payer: Self-pay | Attending: Emergency Medicine | Admitting: Emergency Medicine

## 2014-01-01 ENCOUNTER — Emergency Department (HOSPITAL_COMMUNITY): Payer: No Typology Code available for payment source

## 2014-01-01 ENCOUNTER — Encounter (HOSPITAL_COMMUNITY): Payer: Self-pay | Admitting: Emergency Medicine

## 2014-01-01 DIAGNOSIS — M5430 Sciatica, unspecified side: Secondary | ICD-10-CM | POA: Insufficient documentation

## 2014-01-01 DIAGNOSIS — I10 Essential (primary) hypertension: Secondary | ICD-10-CM | POA: Insufficient documentation

## 2014-01-01 DIAGNOSIS — F17200 Nicotine dependence, unspecified, uncomplicated: Secondary | ICD-10-CM | POA: Insufficient documentation

## 2014-01-01 DIAGNOSIS — R079 Chest pain, unspecified: Secondary | ICD-10-CM

## 2014-01-01 DIAGNOSIS — M7989 Other specified soft tissue disorders: Secondary | ICD-10-CM

## 2014-01-01 DIAGNOSIS — R0789 Other chest pain: Secondary | ICD-10-CM | POA: Insufficient documentation

## 2014-01-01 DIAGNOSIS — G8929 Other chronic pain: Secondary | ICD-10-CM | POA: Insufficient documentation

## 2014-01-01 LAB — BASIC METABOLIC PANEL
BUN: 19 mg/dL (ref 6–23)
CHLORIDE: 112 meq/L (ref 96–112)
CO2: 24 meq/L (ref 19–32)
Calcium: 9.3 mg/dL (ref 8.4–10.5)
Creatinine, Ser: 0.88 mg/dL (ref 0.50–1.35)
GFR calc non Af Amer: >90 mL/min (ref >90)
GLUCOSE: 96 mg/dL (ref 70–99)
POTASSIUM: 4.1 meq/L (ref 3.7–5.3)
Sodium: 146 mEq/L (ref 137–147)

## 2014-01-01 LAB — CBC WITH DIFFERENTIAL/PLATELET
Basophils Absolute: 0 10*3/uL (ref 0.0–0.1)
Basophils Relative: 0 % (ref 0–1)
Eosinophils Absolute: 0.2 10*3/uL (ref 0.0–0.7)
Eosinophils Relative: 2 % (ref 0–5)
HCT: 42.6 % (ref 39.0–52.0)
HEMOGLOBIN: 14.7 g/dL (ref 13.0–17.0)
LYMPHS ABS: 2.1 10*3/uL (ref 0.7–4.0)
LYMPHS PCT: 31 % (ref 12–46)
MCH: 31.4 pg (ref 26.0–34.0)
MCHC: 34.5 g/dL (ref 30.0–36.0)
MCV: 91 fL (ref 78.0–100.0)
MONOS PCT: 6 % (ref 3–12)
Monocytes Absolute: 0.4 10*3/uL (ref 0.1–1.0)
NEUTROS ABS: 4.1 10*3/uL (ref 1.7–7.7)
NEUTROS PCT: 61 % (ref 43–77)
Platelets: 251 10*3/uL (ref 150–400)
RBC: 4.68 MIL/uL (ref 4.22–5.81)
RDW: 13.9 % (ref 11.5–15.5)
WBC: 6.8 10*3/uL (ref 4.0–10.5)

## 2014-01-01 LAB — I-STAT TROPONIN, ED
Troponin i, poc: 0 ng/mL (ref 0.00–0.08)
Troponin i, poc: 0 ng/mL (ref 0.00–0.08)

## 2014-01-01 MED ORDER — ALBUTEROL SULFATE HFA 108 (90 BASE) MCG/ACT IN AERS: 1.0000 | INHALATION_SPRAY | Freq: Four times a day (QID) | RESPIRATORY_TRACT | Status: DC | PRN

## 2014-01-01 MED ORDER — MELOXICAM 7.5 MG PO TABS: 7.5000 mg | ORAL_TABLET | Freq: Every day | ORAL | Status: DC

## 2014-01-01 MED ORDER — ASPIRIN 81 MG PO CHEW
324.0000 mg | CHEWABLE_TABLET | Freq: Once | ORAL | Status: AC
  Administered 2014-01-01: 324 mg via ORAL
  Filled 2014-01-01: qty 4

## 2014-01-01 MED ORDER — MORPHINE SULFATE 4 MG/ML IJ SOLN
4.0000 mg | Freq: Once | INTRAMUSCULAR | Status: AC
  Administered 2014-01-01: 4 mg via INTRAVENOUS
  Filled 2014-01-01: qty 1

## 2014-01-01 MED ORDER — ALBUTEROL SULFATE (2.5 MG/3ML) 0.083% IN NEBU: 2.5000 mg | INHALATION_SOLUTION | Freq: Four times a day (QID) | RESPIRATORY_TRACT | Status: DC | PRN

## 2014-01-01 MED ORDER — ONDANSETRON HCL 4 MG/2ML IJ SOLN
4.0000 mg | Freq: Once | INTRAMUSCULAR | Status: AC
  Administered 2014-01-01: 4 mg via INTRAVENOUS
  Filled 2014-01-01: qty 2

## 2014-01-01 NOTE — ED Notes (Signed)
Pt states he has been "gagging" with SOB and CP as well as right shoulder pain and back pain. Moved to POD A-2.

## 2014-01-01 NOTE — Progress Notes (Signed)
Right upper extremity venous duplex:  No evidence of DVT or superficial thrombosis.    

## 2014-01-01 NOTE — ED Notes (Signed)
Pt states his hands become tingly and red/swollen when his chest starts hurting. He also has numbness on his left lateral thigh. No other numbness noted.

## 2014-01-01 NOTE — ED Provider Notes (Signed)
Medical screening examination/treatment/procedure(s) were performed by non-physician practitioner and as supervising physician I was immediately available for consultation/collaboration.  EKG Interpretation    Date/Time:  Wednesday January 01 2014 10:05:09 EST Ventricular Rate:  72 PR Interval:  140 QRS Duration: 80 QT Interval:  362 QTC Calculation: 396 R Axis:   41 Text Interpretation:  Sinus rhythm Confirmed by Algie Cales  DO, Asjah Rauda ([id number]) on 01/01/2014 10:10:15 AM              Comer, DO 01/01/14 1521

## 2014-01-01 NOTE — ED Provider Notes (Signed)
CSN: [id number]     Arrival date & time 01/01/14  4782 History   First MD Initiated Contact with Patient 01/01/14 0957     Chief Complaint  Patient presents with  . Chest Pain     (Consider location/radiation/quality/duration/timing/severity/associated sxs/prior Treatment) HPI PCP: None  Patient has a pan positive ROS and multiple complaints. Chest pains, right shoulder pain, right hand swelling and redness, left hip pain, left hip numbness and itching and some SOB.   His right shoulder, hand and left leg symptoms are chronic and he has been seen multiple times for these in the ER. They have no changed in quality but the pain is exacerbated. He has arthritis and sciatica.  Chest pains/SOB- Patient has had chest pains on and off for the past 6 months-1 year. The happen whether he is relaxing or exerting himself. They are substernal, last 10 minutes and are a pressure "uncomfortable" pain. He gets nausous with it, no other abnormal symptoms. He reports that his right hand swells and becomes red intermittently but it gets better with elevation. Denies these symptoms to any other extremity.  Risk factors: Hypertension (uncontrolled), smoker and family history  Past Medical History  Diagnosis Date  . Seasonal allergies   . Hypertension   . Chronic back pain   . Sciatica    History reviewed. No pertinent past surgical history. No family history on file. History  Substance Use Topics  . Smoking status: Current Every Day Smoker    Types: Cigarettes  . Smokeless tobacco: Not on file  . Alcohol Use: Yes    Review of Systems  The patient denies anorexia, fever, weight loss,, vision loss, decreased hearing, hoarseness,  syncope, dyspnea on exertion, , balance deficits, hemoptysis, abdominal pain, melena, hematochezia, severe indigestion/heartburn, hematuria, incontinence, genital sores, muscle weakness, suspicious skin lesions, transient blindness, difficulty walking, depression, unusual  weight change, abnormal bleeding, enlarged lymph nodes, angioedema, and breast masses.    Allergies  Review of patient's allergies indicates no known allergies.  Home Medications   Current Outpatient Rx  Name  Route  Sig  Dispense  Refill  . Aspirin-Salicylamide-Caffeine (BC HEADACHE PO)   Oral   Take 1 packet by mouth daily as needed (for pain).         Marland Kitchen ibuprofen (ADVIL,MOTRIN) 200 MG tablet   Oral   Take 400 mg by mouth every 6 (six) hours as needed for moderate pain.         . meloxicam (MOBIC) 7.5 MG tablet   Oral   Take 1 tablet (7.5 mg total) by mouth daily.   30 tablet   0    BP 149/94  Pulse 62  Temp(Src) 98.2 F (36.8 C) (Oral)  Resp 15  Wt 170 lb (77.111 kg)  SpO2 99% Physical Exam  Nursing note and vitals reviewed. Constitutional: He appears well-developed and well-nourished. No distress.  HENT:  Head: Normocephalic and atraumatic.  Eyes: Pupils are equal, round, and reactive to light.  Neck: Normal range of motion. Neck supple.  Cardiovascular: Normal rate and regular rhythm.   Pulmonary/Chest: Effort normal.  Abdominal: Soft.  Musculoskeletal:  Erythema and swelling to right hand and wrist without pain or induration. The redness blanches. CR < 2 second. Strong pulses. Physiologic grip strength.  Neurological: He is alert.  Skin: Skin is warm and dry.    ED Course  Procedures (including critical care time) Labs Review Labs Reviewed  Charles, ED  Randolm Idol, ED   Imaging Review Dg Chest 2 View  01/01/2014   CLINICAL DATA:  Chest pain and weakness.  EXAM: CHEST  2 VIEW  COMPARISON:  08/30/2012  FINDINGS: Normal heart size and mediastinal contours. No acute infiltrate or edema. No effusion or pneumothorax. No acute osseous findings.  IMPRESSION: No active cardiopulmonary disease.   Electronically Signed   By: Jorje Guild M.D.   On: 01/01/2014 10:48    EKG Interpretation     Date/Time:  Wednesday January 01 2014 10:05:09 EST Ventricular Rate:  72 PR Interval:  140 QRS Duration: 80 QT Interval:  362 QTC Calculation: 396 R Axis:   41 Text Interpretation:  Sinus rhythm Confirmed by WARD  DO, KRISTEN ([id number]) on 01/01/2014 10:10:15 AM            MDM   Final diagnoses:  Chest pain  Sciatica     Author: Charlaine Dalton, RVT Service: Vascular Lab Author Type: Cardiovascular Sonographer   Filed: 01/01/2014 11:36 AM Note Time: 01/01/2014 11:36 AM Status: Signed   Editor: Charlaine Dalton, RVT (Cardiovascular Sonographer)      Right upper extremity venous duplex: No evidence of DVT or superficial thrombosis    Patients initial lab/image results are not acute. Patient having episodes of CP with SOB and right arm swelling. He has no PCP, and has risk factors for CAD.   I spoke with cardiology to have an outpatient stress test set up. Trish reports that the office will call him on Saturday to schedule appointment. Pt currently not having any pain. He has had a negative delta Trop.   Will start on maloxicam for pain.   53 y.o.Bradley [name]'s evaluation in the Emergency Department is complete. It has been determined that no acute conditions requiring further emergency intervention are present at this time. The patient/guardian have been advised of the diagnosis and plan. We have discussed signs and symptoms that warrant return to the ED, such as changes or worsening in symptoms.  Vital signs are stable at discharge. Filed Vitals:   01/01/14 1230  BP: 149/94  Pulse: 62  Temp:   Resp: 15    Patient/guardian has voiced understanding and agreed to follow-up with the PCP or specialist.      Linus Mako, PA-C 01/01/14 1428

## 2014-01-01 NOTE — ED Notes (Signed)
C/o right shoulder and mid to lower back pain. Hx of same.

## 2014-01-01 NOTE — Discharge Instructions (Signed)
Chest Pain (Nonspecific) °Chest pain has many causes. Your pain could be caused by something serious, such as a heart attack or a blood clot in the lungs. It could also be caused by something less serious, such as a chest bruise or a virus. Follow up with your doctor. More lab tests or other studies may be needed to find the cause of your pain. Most of the time, nonspecific chest pain will improve within 2 to 3 days of rest and mild pain medicine. °HOME CARE °· For chest bruises, you may put ice on the sore area for 15-20 minutes, 03-04 times a day. Do this only if it makes you feel better. °· Put ice in a plastic bag. °· Place a towel between the skin and the bag. °· Rest for the next 2 to 3 days. °· Go back to work if the pain improves. °· See your doctor if the pain lasts longer than 1 to 2 weeks. °· Only take medicine as told by your doctor. °· Quit smoking if you smoke. °GET HELP RIGHT AWAY IF:  °· There is more pain or pain that spreads to the arm, neck, jaw, back, or belly (abdomen). °· You have shortness of breath. °· You cough more than usual or cough up blood. °· You have very bad back or belly pain, feel sick to your stomach (nauseous), or throw up (vomit). °· You have very bad weakness. °· You pass out (faint). °· You have a fever. °Any of these problems may be serious and may be an emergency. Do not wait to see if the problems will go away. Get medical help right away. Call your local emergency services 911 in [place] Do not drive yourself to the hospital. °MAKE SURE YOU:  °· Understand these instructions. °· Will watch this condition. °· Will get help right away if you or your child is not doing well or gets worse. °Document Released: 04/11/2008 Document Revised: 01/16/2012 Document Reviewed: 04/11/2008 °[employer]® Patient Information ©2014 [employer]. ° °

## 2014-01-01 NOTE — ED Notes (Signed)
Nurse is starting IV and said she will get the blood

## 2014-01-01 NOTE — ED Notes (Signed)
Pt now c/o chest pain. Will move to acute area.

## 2014-01-18 ENCOUNTER — Emergency Department (HOSPITAL_COMMUNITY)
Admission: EM | Admit: 2014-01-18 | Discharge: 2014-01-18 | Disposition: A | Discharge status: Home / Self Care | Payer: No Typology Code available for payment source | Attending: Emergency Medicine | Admitting: Emergency Medicine

## 2014-01-18 ENCOUNTER — Encounter (HOSPITAL_COMMUNITY): Payer: Self-pay | Admitting: Emergency Medicine

## 2014-01-18 DIAGNOSIS — M25519 Pain in unspecified shoulder: Secondary | ICD-10-CM | POA: Insufficient documentation

## 2014-01-18 DIAGNOSIS — M25511 Pain in right shoulder: Secondary | ICD-10-CM

## 2014-01-18 DIAGNOSIS — I10 Essential (primary) hypertension: Secondary | ICD-10-CM | POA: Insufficient documentation

## 2014-01-18 DIAGNOSIS — G8929 Other chronic pain: Secondary | ICD-10-CM

## 2014-01-18 DIAGNOSIS — F17200 Nicotine dependence, unspecified, uncomplicated: Secondary | ICD-10-CM | POA: Insufficient documentation

## 2014-01-18 MED ORDER — METHOCARBAMOL 500 MG PO TABS: 500.0000 mg | ORAL_TABLET | Freq: Two times a day (BID) | ORAL | Status: DC

## 2014-01-18 MED ORDER — IBUPROFEN 400 MG PO TABS
800.0000 mg | ORAL_TABLET | Freq: Once | ORAL | Status: AC
  Administered 2014-01-18: 800 mg via ORAL
  Filled 2014-01-18: qty 2

## 2014-01-18 MED ORDER — MELOXICAM 7.5 MG PO TABS: 15.0000 mg | ORAL_TABLET | Freq: Every day | ORAL | Status: DC

## 2014-01-18 MED ORDER — METHOCARBAMOL 500 MG PO TABS
500.0000 mg | ORAL_TABLET | Freq: Once | ORAL | Status: AC
  Administered 2014-01-18: 500 mg via ORAL
  Filled 2014-01-18: qty 1

## 2014-01-18 NOTE — ED Provider Notes (Signed)
CSN: [id number]     Arrival date & time 01/18/14  1616 History   This chart was scribed for non-physician practitioner Anderson Malta L. Tranae Laramie, PA-C, working with Virgel Manifold, MD, by Neta Ehlers, ED Scribe. This patient was seen in room TR09C/TR09C and the patient's care was started at 4:54 PM.  First MD Initiated Contact with Patient 01/18/14 1621     Chief Complaint  Patient presents with  . Shoulder Pain    The history is provided by the patient. No language interpreter was used.   HPI Comments: Bradley [name] is a 53 y.o. male who presents to the Emergency Department complaining of chronic right shoulder pain which has occurred intermittently for three weeks. He rates the pain as 8/10, and he reports the pain radiates down his right arm into his hand. He also reports he has experienced  "pins and needles" to the arm and that the fingers of his right hand have been swollen. He denies any recent injuries. He has used Advil as well as pain medication prescribed for his back to address the shoulder pain. The pt reports he has experienced constipation as a side-effect of the pain medication, The pt reports he was waiting to be contacted re a follow-up with an orthopedic; he has not contacted an orthopedist.   He reports he has stopped taking his HTN medication because he believes the medication caused him fatigue. He was prescribed the HTN medication by [facility]   Past Medical History  Diagnosis Date  . Seasonal allergies   . Hypertension   . Chronic back pain   . Sciatica    History reviewed. No pertinent past surgical history. History reviewed. No pertinent family history. History  Substance Use Topics  . Smoking status: Current Every Day Smoker    Types: Cigarettes  . Smokeless tobacco: Not on file  . Alcohol Use: Yes    Review of Systems  Constitutional: Negative for fever and chills.  Respiratory: Negative for shortness of breath.   Cardiovascular:  Negative for chest pain.  Musculoskeletal: Positive for arthralgias and myalgias.  Neurological: Negative for headaches.  All other systems reviewed and are negative.   Allergies  Review of patient's allergies indicates no known allergies.  Home Medications   Current Outpatient Rx  Name  Route  Sig  Dispense  Refill  . Aspirin-Salicylamide-Caffeine (BC HEADACHE PO)   Oral   Take 1 packet by mouth daily as needed (for pain).         Marland Kitchen ibuprofen (ADVIL,MOTRIN) 200 MG tablet   Oral   Take 400 mg by mouth every 6 (six) hours as needed for moderate pain.         . meloxicam (MOBIC) 7.5 MG tablet   Oral   Take 1 tablet (7.5 mg total) by mouth daily.   30 tablet   0   . meloxicam (MOBIC) 7.5 MG tablet   Oral   Take 2 tablets (15 mg total) by mouth daily.   30 tablet   0   . methocarbamol (ROBAXIN) 500 MG tablet   Oral   Take 1 tablet (500 mg total) by mouth 2 (two) times daily.   20 tablet   0    Triage Vitals: BP 152/102  Pulse 84  Temp(Src) 97.8 F (36.6 C) (Oral)  Resp 22  Ht 5\' 6"  (1.676 m)  Wt 179 lb (81.194 kg)  BMI 28.91 kg/m2  SpO2 97%  Physical Exam  Constitutional: He is oriented to  person, place, and time. He appears well-developed and well-nourished. No distress.  HENT:  Head: Normocephalic and atraumatic.  Right Ear: External ear normal.  Left Ear: External ear normal.  Nose: Nose normal.  Mouth/Throat: Uvula is midline, oropharynx is clear and moist and mucous membranes are normal.  Eyes: Conjunctivae are normal.  Neck: Normal range of motion. Neck supple. Muscular tenderness present. No spinous process tenderness present. No rigidity. No edema present.    Cardiovascular: Normal rate, regular rhythm and normal heart sounds.   Pulmonary/Chest: Effort normal and breath sounds normal. No respiratory distress. He exhibits no tenderness.  Abdominal: Soft. There is no tenderness.  Musculoskeletal: Normal range of motion. He exhibits no edema.        Right shoulder: He exhibits tenderness. He exhibits normal range of motion, no bony tenderness, no swelling, no effusion, no crepitus, no deformity, no pain, no spasm, normal pulse and normal strength.       Left shoulder: Normal.       Right elbow: Normal.      Left elbow: Normal.       Right wrist: Normal.       Left wrist: Normal.       Right upper arm: Normal.       Left upper arm: Normal.       Right forearm: Normal.       Left forearm: Normal.       Right hand: Normal.       Left hand: Normal.  Negative Empty Can Test Negative Adson's maneuver ROM intact with Apley Scratch Test  Neurological: He is alert and oriented to person, place, and time. GCS eye subscore is 4. GCS verbal subscore is 5. GCS motor subscore is 6.  Skin: Skin is warm and dry. He is not diaphoretic.    ED Course  Procedures (including critical care time) Medications  methocarbamol (ROBAXIN) tablet 500 mg (500 mg Oral Given 01/18/14 1705)  ibuprofen (ADVIL,MOTRIN) tablet 800 mg (800 mg Oral Given 01/18/14 1706)     DIAGNOSTIC STUDIES: Oxygen Saturation is 97% on room air, normal by my interpretation.    COORDINATION OF CARE:  4:56 PM- Discussed treatment plan with patient, which includes NSAIDS and muscle relaxants, and the patient agreed to the plan. Also will provide an orthopedic referral for the pt; informed the pt he was responsible for following-up with the orthopedic. Also will give a referral for a PCP re the pt's HTN.   Labs Review Labs Reviewed - No data to display Imaging Review No results found.   EKG Interpretation None      MDM   Final diagnoses:  Chronic right shoulder pain  Hypertension    Filed Vitals:   01/18/14 1713  BP: 152/103  Pulse: 77  Temp:   Resp: 20    Afebrile, NAD, non-toxic appearing, AAOx4.   1) Chronic Shoulder Pain: PE shows no instability, tenderness, or deformity of acromioclavicular and sternoclavicular joints, the cervical spine, glenohumeral  joint, coracoid process, acromion, or scapula. Good shoulder strength during empty can test. Good ROM during scratch test. No signs of impingement on Adson's manuever. Pain is chronic and unchanged from multiple previous visits to ED. No new findings. Will treat with NSAIDs and Robaxin.   2) HTN: Patient noted to be hypertensive in the emergency department.  No signs of hypertensive urgency.  Discussed with patient the need for close follow-up and management by their primary care physician.   Advised orthopedic and PCP f/u. Patient  is stable at time of discharge  I personally performed the services described in this documentation, which was scribed in my presence. The recorded information has been reviewed and is accurate.     Harlow Mares, PA-C 01/18/14 1753

## 2014-01-18 NOTE — ED Notes (Signed)
Pt rode bus to ED.

## 2014-01-18 NOTE — ED Notes (Signed)
Pt presents to department for evaluation of chronic R shoulder pain. Ongoing for several weeks. States pain to shoulder, R arm and hand, becomes worse with movement. 8/10 pain upon arrival. Pt is alert and oriented x4.

## 2014-01-18 NOTE — ED Notes (Signed)
Onset 3 weeks constant right shoulder and arm pain.  Numbness/tingling to right arm.  Right hand swelling.  Denies chest pain or shortness of breath.  Pain interfering with sleep and normal activities.

## 2014-01-18 NOTE — Discharge Instructions (Signed)
Please follow up with your primary care physician in 1-2 days. If you do not have one please call the [place] number listed above. Please discuss going back on your blood pressure medications as it was high at today's visit. Please follow up with the orthopedist, Dr. Erlinda Hong, to schedule a follow up appointment. Please take pain medication and/or muscle relaxants as prescribed and as needed for pain. Please do not drive on narcotic pain medication or on muscle relaxants. Please take Mobic as prescribed. Please read all discharge instructions and return precautions.

## 2014-01-21 NOTE — ED Provider Notes (Signed)
Medical screening examination/treatment/procedure(s) were performed by non-physician practitioner and as supervising physician I was immediately available for consultation/collaboration.   EKG Interpretation None       Virgel Manifold, MD 01/21/14 2313

## 2014-02-25 ENCOUNTER — Emergency Department (HOSPITAL_COMMUNITY)
Admission: EM | Admit: 2014-02-25 | Discharge: 2014-02-25 | Disposition: A | Discharge status: Home / Self Care | Payer: No Typology Code available for payment source | Attending: Emergency Medicine | Admitting: Emergency Medicine

## 2014-02-25 ENCOUNTER — Encounter (HOSPITAL_COMMUNITY): Payer: Self-pay | Admitting: Emergency Medicine

## 2014-02-25 DIAGNOSIS — Z79899 Other long term (current) drug therapy: Secondary | ICD-10-CM | POA: Insufficient documentation

## 2014-02-25 DIAGNOSIS — M5430 Sciatica, unspecified side: Secondary | ICD-10-CM | POA: Insufficient documentation

## 2014-02-25 DIAGNOSIS — Z791 Long term (current) use of non-steroidal anti-inflammatories (NSAID): Secondary | ICD-10-CM | POA: Insufficient documentation

## 2014-02-25 DIAGNOSIS — F17200 Nicotine dependence, unspecified, uncomplicated: Secondary | ICD-10-CM | POA: Insufficient documentation

## 2014-02-25 DIAGNOSIS — S46812A Strain of other muscles, fascia and tendons at shoulder and upper arm level, left arm, initial encounter: Secondary | ICD-10-CM

## 2014-02-25 DIAGNOSIS — Y929 Unspecified place or not applicable: Secondary | ICD-10-CM | POA: Insufficient documentation

## 2014-02-25 DIAGNOSIS — Y939 Activity, unspecified: Secondary | ICD-10-CM | POA: Insufficient documentation

## 2014-02-25 DIAGNOSIS — S46819A Strain of other muscles, fascia and tendons at shoulder and upper arm level, unspecified arm, initial encounter: Principal | ICD-10-CM

## 2014-02-25 DIAGNOSIS — S43499A Other sprain of unspecified shoulder joint, initial encounter: Secondary | ICD-10-CM | POA: Insufficient documentation

## 2014-02-25 DIAGNOSIS — I10 Essential (primary) hypertension: Secondary | ICD-10-CM | POA: Insufficient documentation

## 2014-02-25 DIAGNOSIS — X58XXXA Exposure to other specified factors, initial encounter: Secondary | ICD-10-CM | POA: Insufficient documentation

## 2014-02-25 DIAGNOSIS — G8929 Other chronic pain: Secondary | ICD-10-CM | POA: Insufficient documentation

## 2014-02-25 MED ORDER — CYCLOBENZAPRINE HCL 10 MG PO TABS: 10.0000 mg | ORAL_TABLET | Freq: Two times a day (BID) | ORAL | Status: DC | PRN

## 2014-02-25 MED ORDER — IBUPROFEN 800 MG PO TABS: 800.0000 mg | ORAL_TABLET | Freq: Three times a day (TID) | ORAL | Status: DC

## 2014-02-25 NOTE — Discharge Instructions (Signed)
Take ibuprofen as needed for pain. Take Flexeril as needed for muscle spasm. Follow up with a doctor from the resource guide for further evaluation.    Emergency Department Resource Guide 1) Find a Doctor and Pay Out of Pocket Although you won't have to find out who is covered by your insurance plan, it is a good idea to ask around and get recommendations. You will then need to call the office and see if the doctor you have chosen will accept you as a new patient and what types of options they offer for patients who are self-pay. Some doctors offer discounts or will set up payment plans for their patients who do not have insurance, but you will need to ask so you aren't surprised when you get to your appointment.  2) Contact Your Local Health Department Not all health departments have doctors that can see patients for sick visits, but many do, so it is worth a call to see if yours does. If you don't know where your local health department is, you can check in your phone book. The CDC also has a tool to help you locate your state's health department, and many state websites also have listings of all of their local health departments.  3) Find a [facility] If your illness is not likely to be very severe or complicated, you may want to try a walk in clinic. These are popping up all over the country in pharmacies, drugstores, and shopping centers. They're usually staffed by nurse practitioners or physician assistants that have been trained to treat common illnesses and complaints. They're usually fairly quick and inexpensive. However, if you have serious medical issues or chronic medical problems, these are probably not your best option.  No Primary Care Doctor: - Call Health Connect at  [phone] - they can help you locate a primary care doctor that  accepts your insurance, provides certain services, etc. - Physician Referral Service- [phone]  Chronic Pain Problems: Organization          Address  Phone   Notes  [facility]  [phone] Patients need to be referred by their primary care doctor.   Medication Assistance: Organization         Address  Phone   Notes  [facility] Medication [facility] [address] [place], [place] [address] [phone] --Must be a resident of [facility] -- Must have NO insurance coverage whatsoever (no Medicaid/ Medicare, etc.) -- The pt. MUST have a primary care doctor that directs their care regularly and follows them in the community   MedAssist  [phone]   [employer]  [phone]    Agencies that provide inexpensive medical care: Organization         Address  Phone   Notes  [place]  [phone]   [facility]    [phone]   [facility] [place], [place] [address] [phone]   [place] [address] [place] [phone]   Planned Parenthood    [phone]   [facility]    [phone]   [place] and [place] [address], [place] Phone:  [phone], Fax:  [phone] Hours of Operation:  9 am - 6 pm, M-F.  Also accepts Medicaid/Medicare and self-pay.  [facility] for [place] [address], [address],  [place] Phone: [phone], Fax: [phone]. Hours of Operation:  8:30 am - 5:30 pm, M-F.  Also accepts Medicaid and self-pay.  [facility] [place] [address], [place] Phone: [phone]   [place], [place], [place] [phone], Ext. [phone] Mondays & Thursdays: 7-9 AM.  First 15 patients are seen on a first come, first serve basis.    [place] Providers:  Organization         Address  Phone   Notes  [facility] [address], [place] [phone] Also accepts self-pay patients.  [facility] [address], [place]  [phone]   [place], [address], [place] [phone]   [facility] [address], [place] [phone]   Lucianne Lei [address], [place]   [phone] Only accepts [place] Access [place] patients after they have their name applied to their card.   Self-Pay (no insurance) in [facility]:  Organization         Address  Phone   Notes  Sickle Cell Patients, [facility] Internal [facility] [place] [phone]   [facility]gent Care [place] [phone]   [facility] [place]  [place], [place], [place] [phone]   [facility]/Dr. Osei-Bonsu  [address], [place] or [address], [place] [phone] Phone number for both [place] and [place] locations is the same.  [facility]gent [facility] and [facility] [address], [place] [phone]   [facility] [address] [place] or [address] [phone]   [facility] [address], [place] [phone], phone; [phone], fax Sees patients 1st and 3rd Saturday of every month.  Must not qualify for public or private insurance (i.e. Medicaid, Medicare, [place] Health Choice, Veterans' Benefits)  Household income should be no more than 200% of the poverty level The clinic cannot treat you if you are pregnant or think you are pregnant  Sexually transmitted diseases are not treated at the clinic.    Dental Care: Organization         Address  Phone  Notes  [facility] Department of [facility] [phone] Accepts children up to age 47 who are enrolled in [place] or [place]; pregnant women with a Medicaid card; and  children who have applied for Medicaid or [place] Health Choice, but were declined, whose parents can pay a reduced fee at time of service.  [facility] Department of Operating Room Services  [address], [place] [phone] Accepts children up to age 20 who are enrolled in [place] or [place]; pregnant women with a Medicaid card; and children who have applied for Medicaid or [place] Health Choice, but were declined, whose parents can pay a reduced fee at time of service.  [place] Adult Dental Access PROGRAM  [place] [phone] Patients are seen by appointment only. Walk-ins are not accepted. [place] will see patients 24 years of age and older. Monday - Tuesday (8am-5pm) Most Wednesdays (8:30-5pm) $30 per visit, cash only  [place]  [place]  [address], [facility] [phone] Patients are seen by appointment only. Walk-ins are not accepted. [place] will see patients 60 years of age and older. One Wednesday Evening (Monthly: Volunteer Based).  $30 per visit, cash only  [place]  [phone] for adults; Children under age 70, call Graduate Pediatric Dentistry at [phone]. Children aged 74-14, please call [phone] to request a pediatric application.  Dental services are provided in all areas of dental care including fillings, crowns and bridges, complete and partial dentures, implants, gum treatment, root canals, and extractions. Preventive care is also provided. Treatment is provided to both adults and children. Patients are selected via a lottery and there is often a waiting list.   [facility] [address] [place]  [phone] [email or web address]   [facility] [address] [place], [place] [phone], Ext. [phone] Second and Fourth Thursday of each month, opens at 6:30 AM; Clinic ends at 9 AM.  Patients are seen on a first-come first-served  basis, and a limited number are seen during each clinic.   [facility]  [address] Hillard Danker [place], [place] [phone]   Eligibility Requirements You must have lived in [place], [place], or [place] counties for at least the last three months.   You cannot be eligible for state or federal sponsored [employer], including [employer], [place], or [employer].   You generally cannot be eligible for healthcare insurance through your employer.    How to apply: Eligibility screenings are held every Tuesday and Wednesday afternoon from 1:00 pm until 4:00 pm. You do not need an appointment for the interview!  [facility] [address] [place], [place]   [place]  [place] Department  [place]  [phone]    Behavioral Health Resources in the Community: Intensive Outpatient Programs Organization         Address  Phone  Notes  [place]is Vancleave. [address] [place], [place] [phone]   [facility] Outpatient [address] [place], [place]   ADS: Alcohol & Drug Svcs [address] [place], [place]   [place] [address],  [place], [place] or [phone]   Substance Abuse Resources Organization         Address  Phone  Notes  Alcohol and Drug Services  [phone]   [place]  [phone]   The [place]   Chinita Pester  [phone]   Residential & Outpatient Substance Abuse Program  [phone]   Psychological Services Organization         Address  Phone  Notes  [facility] [place]  [place]  [phone]   [place] [address], [place] or [phone]    Mobile Crisis Teams Organization          Address  Phone  Notes  [facility], Mobile Crisis Care Unit  [phone]   [facility]  [address] [place], [place]   [place] Levels [address], [place] [phone]    Self-Help/Support Groups Organization         Address  Phone             Notes  [place]. of [place] - variety  of support groups  336- ([phone] Call for more information  Narcotics Anonymous (NA), [facility] [address], [employer] [place]  2 meetings at this location   Residential Facilities manager         Address  Phone  Notes  [facility] [place],    [place]  [phone]   [facility]  [address], [place] [address], [place], [place]   [place], [place] [phone] Admissions: 8am-3pm M-F  Incentives Substance [place] [address]    [place], [place] [phone]   [facility] [address] [place], [place], [place]   [facility] [address],  [place], [place]   [facility] Programs - Intensive Outpatient [address] Kristeen [address], [place], [place]   [facility] ([place].,  [place], [place] [phone] or [phone]   [facility]idential Treatment Services (RTS) [address] [place], [place] Accepts Medicaid  [facility] [place] [address],  [place] [phone] Substance Abuse/Addiction Treatment   [facility] Organization         Address  Phone  Notes  [facility]  [phone]   Domenic Schwab, PhD [address] Arlis Porta [place], [place]   [phone] or [phone]   [place], [place] [phone]   [facility] [address], [place], [place] [phone] Insurance/Medicaid/sponsorship  through [facility] [place] and Families [address] [place]                                    [place], [place] [phone] [place] [address], [place] [phone]    Dr. Adele Schilder  [phone]   [facility] Dept. 1) [address], [place] 2) [place] 3)  [place] [address], [place] [phone]  [phone]   [place] [phone] or [phone] (After Hours)

## 2014-02-25 NOTE — ED Notes (Signed)
Pt reports bilateral arm pain radiating from shoulders down to fingers x 3-4 days.

## 2014-02-25 NOTE — ED Notes (Signed)
Onset 1 week left shoulder and arm pain.  Pins and needles feeling to bilateral fingers.  Pain and numbness to lateral leg from hip to knee.  Pain getting worse.  Pt has been seen for right shoulder and arm pain.  Now pain on left side.

## 2014-02-25 NOTE — ED Provider Notes (Signed)
CSN: [id number]     Arrival date & time 02/25/14  1145 History  This chart was scribed for non-physician practitioner Alvina Chou, PA-C working with [place], DO by Ludger Nutting, ED Scribe. This patient was seen in room TR09C/TR09C and the patient's care was started at 1:07 PM.    Chief Complaint  Patient presents with  . Arm Pain      The history is provided by the patient. No language interpreter was used.    HPI Comments: Bradley [name] is a 53 y.o. male who presents to the Emergency Department complaining of 1 week of constant, left upper extremity pain that radiates from the shoulder to the hand. Patient reports similar symptoms to the RUE several weeks ago.  He has been using BC powders and other OTC pain medication without relief. He denies numbness or weakness.   Past Medical History  Diagnosis Date  . Seasonal allergies   . Hypertension   . Chronic back pain   . Sciatica    History reviewed. No pertinent past surgical history. No family history on file. History  Substance Use Topics  . Smoking status: Current Every Day Smoker    Types: Cigarettes  . Smokeless tobacco: Not on file  . Alcohol Use: Yes    Review of Systems  Musculoskeletal: Positive for myalgias (LUE).  Neurological: Negative for weakness and numbness.  All other systems reviewed and are negative.     Allergies  Review of patient's allergies indicates no known allergies.  Home Medications   Prior to Admission medications   Medication Sig Start Date End Date Taking? Authorizing Provider  Aspirin-Salicylamide-Caffeine (BC HEADACHE PO) Take 1 packet by mouth daily as needed (for pain).    Historical Provider, MD  ibuprofen (ADVIL,MOTRIN) 200 MG tablet Take 400 mg by mouth every 6 (six) hours as needed for moderate pain.    Historical Provider, MD  meloxicam (MOBIC) 7.5 MG tablet Take 1 tablet (7.5 mg total) by mouth daily. 01/01/14   Tiffany Marilu Favre, PA-C  meloxicam (MOBIC) 7.5 MG  tablet Take 2 tablets (15 mg total) by mouth daily. 01/18/14   Jennifer L Piepenbrink, PA-C  methocarbamol (ROBAXIN) 500 MG tablet Take 1 tablet (500 mg total) by mouth 2 (two) times daily. 01/18/14   Jennifer L Piepenbrink, PA-C   BP 105/77  Pulse 86  Temp(Src) 98 F (36.7 C) (Oral)  Resp 16  SpO2 98% Physical Exam  Nursing note and vitals reviewed. Constitutional: He is oriented to person, place, and time. He appears well-developed and well-nourished.  HENT:  Head: Normocephalic and atraumatic.  Cardiovascular: Normal rate.   Pulmonary/Chest: Effort normal.  Abdominal: He exhibits no distension.  Musculoskeletal:  Left trapezius tenderness to palpation. No left shoulder deformity. Limited ROM of left shoulder due to pain.   Neurological: He is alert and oriented to person, place, and time.  LUE strength and sensation equal and intact.   Skin: Skin is warm and dry.  Psychiatric: He has a normal mood and affect.    ED Course  Procedures (including critical care time)  DIAGNOSTIC STUDIES: Oxygen Saturation is 98% on RA, normal by my interpretation.    COORDINATION OF CARE: 1:10 PM Will discharge home with muscle relaxer and ibuprofen. Discussed treatment plan with pt at bedside and pt agreed to plan.   Labs Review Labs Reviewed - No data to display  Imaging Review No results found.   EKG Interpretation None      MDM  Final diagnoses:  Strain of left trapezius muscle    Patient reports having pain starting in his left trapezius and radiates down arm. Patient likely has a strain of her left trapezius muscle.   I personally performed the services described in this documentation, which was scribed in my presence. The recorded information has been reviewed and is accurate.   Alvina Chou, PA-C 02/26/14 [phone]

## 2014-02-26 NOTE — ED Provider Notes (Signed)
Medical screening examination/treatment/procedure(s) were performed by non-physician practitioner and as supervising physician I was immediately available for consultation/collaboration.   EKG Interpretation None        Kristen N Ward, DO 02/26/14 0912 

## 2014-12-10 ENCOUNTER — Observation Stay (HOSPITAL_COMMUNITY)
Admission: EM | Admit: 2014-12-10 | Discharge: 2014-12-12 | Disposition: A | Discharge status: Home / Self Care | Payer: 59 | Attending: Internal Medicine | Admitting: Internal Medicine

## 2014-12-10 ENCOUNTER — Emergency Department (HOSPITAL_COMMUNITY): Payer: 59

## 2014-12-10 ENCOUNTER — Encounter (HOSPITAL_COMMUNITY): Payer: Self-pay | Admitting: Physical Medicine and Rehabilitation

## 2014-12-10 DIAGNOSIS — R0789 Other chest pain: Principal | ICD-10-CM | POA: Insufficient documentation

## 2014-12-10 DIAGNOSIS — M549 Dorsalgia, unspecified: Secondary | ICD-10-CM | POA: Diagnosis not present

## 2014-12-10 DIAGNOSIS — E876 Hypokalemia: Secondary | ICD-10-CM | POA: Diagnosis not present

## 2014-12-10 DIAGNOSIS — Z8249 Family history of ischemic heart disease and other diseases of the circulatory system: Secondary | ICD-10-CM | POA: Diagnosis not present

## 2014-12-10 DIAGNOSIS — Z7982 Long term (current) use of aspirin: Secondary | ICD-10-CM | POA: Diagnosis not present

## 2014-12-10 DIAGNOSIS — D164 Benign neoplasm of bones of skull and face: Secondary | ICD-10-CM | POA: Diagnosis not present

## 2014-12-10 DIAGNOSIS — F17210 Nicotine dependence, cigarettes, uncomplicated: Secondary | ICD-10-CM | POA: Diagnosis not present

## 2014-12-10 DIAGNOSIS — I10 Essential (primary) hypertension: Secondary | ICD-10-CM | POA: Diagnosis not present

## 2014-12-10 DIAGNOSIS — R42 Dizziness and giddiness: Secondary | ICD-10-CM | POA: Diagnosis not present

## 2014-12-10 DIAGNOSIS — G8929 Other chronic pain: Secondary | ICD-10-CM | POA: Diagnosis not present

## 2014-12-10 DIAGNOSIS — I160 Hypertensive urgency: Secondary | ICD-10-CM | POA: Diagnosis present

## 2014-12-10 DIAGNOSIS — R079 Chest pain, unspecified: Secondary | ICD-10-CM | POA: Diagnosis present

## 2014-12-10 DIAGNOSIS — Z791 Long term (current) use of non-steroidal anti-inflammatories (NSAID): Secondary | ICD-10-CM | POA: Insufficient documentation

## 2014-12-10 DIAGNOSIS — R0602 Shortness of breath: Secondary | ICD-10-CM | POA: Diagnosis not present

## 2014-12-10 DIAGNOSIS — Z23 Encounter for immunization: Secondary | ICD-10-CM | POA: Insufficient documentation

## 2014-12-10 DIAGNOSIS — I2510 Atherosclerotic heart disease of native coronary artery without angina pectoris: Secondary | ICD-10-CM | POA: Insufficient documentation

## 2014-12-10 DIAGNOSIS — R55 Syncope and collapse: Secondary | ICD-10-CM | POA: Diagnosis present

## 2014-12-10 HISTORY — DX: Tobacco use: Z72.0

## 2014-12-10 HISTORY — DX: Unspecified osteoarthritis, unspecified site: M19.90

## 2014-12-10 HISTORY — DX: Chronic sinusitis, unspecified: J32.9

## 2014-12-10 HISTORY — DX: Calculus of kidney: N20.0

## 2014-12-10 LAB — CBC WITH DIFFERENTIAL/PLATELET
Basophils Absolute: 0 10*3/uL (ref 0.0–0.1)
Basophils Relative: 0 % (ref 0–1)
EOS ABS: 0.2 10*3/uL (ref 0.0–0.7)
Eosinophils Relative: 3 % (ref 0–5)
HCT: 41.9 % (ref 39.0–52.0)
Hemoglobin: 14.6 g/dL (ref 13.0–17.0)
Lymphocytes Relative: 32 % (ref 12–46)
Lymphs Abs: 1.9 10*3/uL (ref 0.7–4.0)
MCH: 31.5 pg (ref 26.0–34.0)
MCHC: 34.8 g/dL (ref 30.0–36.0)
MCV: 90.3 fL (ref 78.0–100.0)
MONOS PCT: 6 % (ref 3–12)
Monocytes Absolute: 0.4 10*3/uL (ref 0.1–1.0)
NEUTROS ABS: 3.6 10*3/uL (ref 1.7–7.7)
Neutrophils Relative %: 59 % (ref 43–77)
PLATELETS: 213 10*3/uL (ref 150–400)
RBC: 4.64 MIL/uL (ref 4.22–5.81)
RDW: 13.5 % (ref 11.5–15.5)
WBC: 6.1 10*3/uL (ref 4.0–10.5)

## 2014-12-10 LAB — I-STAT TROPONIN, ED
TROPONIN I, POC: 0 ng/mL (ref 0.00–0.08)
Troponin i, poc: 0 ng/mL (ref 0.00–0.08)

## 2014-12-10 LAB — COMPREHENSIVE METABOLIC PANEL
ALBUMIN: 3.8 g/dL (ref 3.5–5.2)
ALT: 19 U/L (ref 0–53)
AST: 20 U/L (ref 0–37)
Alkaline Phosphatase: 77 U/L (ref 39–117)
Anion gap: 6 (ref 5–15)
BILIRUBIN TOTAL: 0.5 mg/dL (ref 0.3–1.2)
BUN: 9 mg/dL (ref 6–23)
CALCIUM: 8.9 mg/dL (ref 8.4–10.5)
CO2: 27 mmol/L (ref 19–32)
Chloride: 108 mmol/L (ref 96–112)
Creatinine, Ser: 0.82 mg/dL (ref 0.50–1.35)
Glucose, Bld: 103 mg/dL — ABNORMAL HIGH (ref 70–99)
Potassium: 3.4 mmol/L — ABNORMAL LOW (ref 3.5–5.1)
SODIUM: 141 mmol/L (ref 135–145)
TOTAL PROTEIN: 6.8 g/dL (ref 6.0–8.3)

## 2014-12-10 LAB — MRSA PCR SCREENING: MRSA BY PCR: NEGATIVE

## 2014-12-10 LAB — TROPONIN I: Troponin I: <0.03 ng/mL (ref <0.031)

## 2014-12-10 MED ORDER — HYDROCODONE-ACETAMINOPHEN 5-325 MG PO TABS
1.0000 | ORAL_TABLET | ORAL | Status: DC | PRN
  Administered 2014-12-11 – 2014-12-12 (×5): 1 via ORAL
  Filled 2014-12-10 (×5): qty 1

## 2014-12-10 MED ORDER — ALUM & MAG HYDROXIDE-SIMETH 200-200-20 MG/5ML PO SUSP: 30.0000 mL | Freq: Four times a day (QID) | ORAL | Status: DC | PRN

## 2014-12-10 MED ORDER — ZOLPIDEM TARTRATE 5 MG PO TABS
5.0000 mg | ORAL_TABLET | Freq: Once | ORAL | Status: AC
  Administered 2014-12-10: 21:00:00 5 mg via ORAL
  Filled 2014-12-10: qty 1

## 2014-12-10 MED ORDER — HYDRALAZINE HCL 50 MG PO TABS
50.0000 mg | ORAL_TABLET | Freq: Three times a day (TID) | ORAL | Status: DC
  Administered 2014-12-10 – 2014-12-12 (×7): 50 mg via ORAL
  Filled 2014-12-10 (×11): qty 1

## 2014-12-10 MED ORDER — METHOCARBAMOL 500 MG PO TABS: 500.0000 mg | ORAL_TABLET | Freq: Two times a day (BID) | ORAL | Status: DC

## 2014-12-10 MED ORDER — POTASSIUM CHLORIDE CRYS ER 20 MEQ PO TBCR
40.0000 meq | EXTENDED_RELEASE_TABLET | ORAL | Status: AC
  Administered 2014-12-10 (×2): 40 meq via ORAL
  Filled 2014-12-10 (×2): qty 2

## 2014-12-10 MED ORDER — CARVEDILOL 6.25 MG PO TABS
6.2500 mg | ORAL_TABLET | Freq: Two times a day (BID) | ORAL | Status: DC
  Administered 2014-12-10 – 2014-12-12 (×5): 6.25 mg via ORAL
  Filled 2014-12-10 (×2): qty 1
  Filled 2014-12-10: qty 2
  Filled 2014-12-10 (×4): qty 1

## 2014-12-10 MED ORDER — GUAIFENESIN-DM 100-10 MG/5ML PO SYRP: 5.0000 mL | ORAL_SOLUTION | ORAL | Status: DC | PRN

## 2014-12-10 MED ORDER — ONDANSETRON HCL 4 MG PO TABS: 4.0000 mg | ORAL_TABLET | Freq: Four times a day (QID) | ORAL | Status: DC | PRN

## 2014-12-10 MED ORDER — ONDANSETRON HCL 4 MG/2ML IJ SOLN: 4.0000 mg | Freq: Four times a day (QID) | INTRAMUSCULAR | Status: DC | PRN

## 2014-12-10 MED ORDER — SODIUM CHLORIDE 0.9 % IJ SOLN: 3.0000 mL | Freq: Two times a day (BID) | INTRAMUSCULAR | Status: DC

## 2014-12-10 MED ORDER — ASPIRIN 81 MG PO CHEW
81.0000 mg | CHEWABLE_TABLET | Freq: Every day | ORAL | Status: DC
  Administered 2014-12-10 – 2014-12-11 (×2): 81 mg via ORAL
  Filled 2014-12-10 (×2): qty 1

## 2014-12-10 MED ORDER — PNEUMOCOCCAL VAC POLYVALENT 25 MCG/0.5ML IJ INJ
0.5000 mL | INJECTION | INTRAMUSCULAR | Status: AC
  Administered 2014-12-11: 0.5 mL via INTRAMUSCULAR
  Filled 2014-12-10: qty 0.5

## 2014-12-10 MED ORDER — ENOXAPARIN SODIUM 40 MG/0.4ML ~~LOC~~ SOLN
40.0000 mg | SUBCUTANEOUS | Status: DC
  Administered 2014-12-10 – 2014-12-11 (×2): 40 mg via SUBCUTANEOUS
  Filled 2014-12-10 (×3): qty 0.4

## 2014-12-10 MED ORDER — MECLIZINE HCL 25 MG PO TABS
25.0000 mg | ORAL_TABLET | Freq: Three times a day (TID) | ORAL | Status: DC
  Administered 2014-12-10 – 2014-12-12 (×6): 25 mg via ORAL
  Filled 2014-12-10 (×9): qty 1

## 2014-12-10 MED ORDER — POLYETHYLENE GLYCOL 3350 17 G PO PACK
17.0000 g | PACK | Freq: Every day | ORAL | Status: DC | PRN
  Filled 2014-12-10: qty 1

## 2014-12-10 MED ORDER — AMLODIPINE BESYLATE 10 MG PO TABS
10.0000 mg | ORAL_TABLET | Freq: Every day | ORAL | Status: DC
  Administered 2014-12-10 – 2014-12-12 (×3): 10 mg via ORAL
  Filled 2014-12-10: qty 2
  Filled 2014-12-10 (×2): qty 1

## 2014-12-10 NOTE — ED Notes (Signed)
Pt presents to ED with complaint of chest pain, central chest sharp in nature that spreads to left and right arm. Complaining of numbness and tingling in right hand and fingers. Also reports dizziness and shortness of breath. Reports nausea and 2 episodes of vomiting yesterday. Pt denies ever having chest pain similar to this. Hx of HTN and states he quit his meds because his BP was "good"; denies any other medical hx at this time. Alert and oriented; no objective neuro deficits noted.

## 2014-12-10 NOTE — ED Notes (Signed)
Pt taken off unit with MRI

## 2014-12-10 NOTE — H&P (Signed)
Patient Demographics  Bradley [name], is a 54 y.o. male  MRN: [id number]   DOB - 1961-01-14  Admit Date - 12/10/2014  Outpatient Primary MD for the patient is No PCP Per Patient   With History of -  Past Medical History  Diagnosis Date  . Seasonal allergies   . Hypertension   . Chronic back pain   . Sciatica       History reviewed. No pertinent past surgical history.  in for   Chief Complaint  Patient presents with  . Dizziness  . Near Syncope     HPI  Bradley [name]  is a 54 y.o. male, Hx of hypertension, chronic back pain, sciatica, who does not take his blood pressure medications as he thinks his blood pressure has been running fine, comes to the hospital with 4-5 day history of dizziness, exertional substernal nonradiating left-sided chest pressure with no aggravating or relieving factors or associated symptoms, sensation of things spinning around him, came to the ER.  In the ER head CT and MRI nonacute, blood pressure suggestive of hypertensive urgency. EKG with some inferior T-wave inversions, blood work unremarkable including troponin. I was called to admit the patient for hypertensive urgency.    Review of Systems    In addition to the HPI above,  No Fever-chills, Mild generalized Headache with some vertigo and dizziness, No changes with Vision or hearing, No problems swallowing food or Liquids, As above Chest pain, Cough or Shortness of Breath, No Abdominal pain, No Nausea or Vommitting, Bowel movements are regular, No Blood in stool or Urine, No dysuria, No new skin rashes or bruises, No new joints pains-aches,  No new weakness, tingling, numbness in any extremity, No recent weight gain or loss, No polyuria, polydypsia or polyphagia, No significant Mental Stressors.  A full 10  point Review of Systems was done, except as stated above, all other Review of Systems were negative.   Social History History  Substance Use Topics  . Smoking status: Current Every Day Smoker    Types: Cigarettes  . Smokeless tobacco: Not on file  . Alcohol Use: Yes      Family History CAD in his father  Prior to Admission medications   Medication Sig Start Date End Date Taking? Authorizing Provider  Aspirin-Salicylamide-Caffeine (BC HEADACHE PO) Take 1 packet by mouth daily as needed (for pain).   Yes Historical Provider, MD  ibuprofen (ADVIL,MOTRIN) 800 MG tablet Take 1 tablet (800 mg total) by mouth 3 (three) times daily. 02/25/14  Yes Kaitlyn Szekalski, PA-C  meloxicam (MOBIC) 7.5 MG tablet Take 2 tablets (15 mg total) by mouth daily. 01/18/14  Yes Jennifer L Piepenbrink, PA-C  PRESCRIPTION MEDICATION Take 1 tablet by mouth daily. unknown blood pressure medication, not on file at any pharmacy   Yes Historical Provider, MD  cyclobenzaprine (FLEXERIL) 10 MG tablet Take 1 tablet (10 mg total) by mouth 2 (two) times daily as needed  for muscle spasms. Patient not taking: Reported on 12/10/2014 02/25/14   Alvina Chou, PA-C  meloxicam (MOBIC) 7.5 MG tablet Take 1 tablet (7.5 mg total) by mouth daily. Patient not taking: Reported on 12/10/2014 01/01/14   Linus Mako, PA-C  methocarbamol (ROBAXIN) 500 MG tablet Take 1 tablet (500 mg total) by mouth 2 (two) times daily. Patient not taking: Reported on 12/10/2014 01/18/14   Stephani Police Piepenbrink, PA-C    No Known Allergies  Physical Exam  Vitals  Blood pressure 197/112, pulse 55, temperature 98.2 F (36.8 C), temperature source Oral, resp. rate 9, SpO2 100 %.   1. General middle-aged African-American male lying in bed in NAD,    2. Normal affect and insight, Not Suicidal or Homicidal, Awake Alert, Oriented X 3.  3. No F.N deficits, ALL C.Nerves Intact, Strength 5/5 all 4 extremities, Sensation intact all 4 extremities, Plantars  down going.  4. Ears and Eyes appear Normal, Conjunctivae clear, PERRLA. Moist Oral Mucosa.  5. Supple Neck, No JVD, No cervical lymphadenopathy appriciated, No Carotid Bruits.  6. Symmetrical Chest wall movement, Good air movement bilaterally, CTAB.  7. RRR, No Gallops, Rubs or Murmurs, No Parasternal Heave.  8. Positive Bowel Sounds, Abdomen Soft, No tenderness, No organomegaly appriciated,No rebound -guarding or rigidity.  9.  No Cyanosis, Normal Skin Turgor, No Skin Rash or Bruise.  10. Good muscle tone,  joints appear normal , no effusions, Normal ROM.  11. No Palpable Lymph Nodes in Neck or Axillae     Data Review  CBC  Recent Labs Lab 12/10/14 0958  WBC 6.1  HGB 14.6  HCT 41.9  PLT 213  MCV 90.3  MCH 31.5  MCHC 34.8  RDW 13.5  LYMPHSABS 1.9  MONOABS 0.4  EOSABS 0.2  BASOSABS 0.0   ------------------------------------------------------------------------------------------------------------------  Chemistries   Recent Labs Lab 12/10/14 0958  NA 141  K 3.4*  CL 108  CO2 27  GLUCOSE 103*  BUN 9  CREATININE 0.82  CALCIUM 8.9  AST 20  ALT 19  ALKPHOS 77  BILITOT 0.5   ------------------------------------------------------------------------------------------------------------------ CrCl cannot be calculated (Unknown ideal weight.). ------------------------------------------------------------------------------------------------------------------ No results for input(s): TSH, T4TOTAL, T3FREE, THYROIDAB in the last 72 hours.  Invalid input(s): FREET3   Coagulation profile No results for input(s): INR, PROTIME in the last 168 hours. ------------------------------------------------------------------------------------------------------------------- No results for input(s): DDIMER in the last 72 hours. -------------------------------------------------------------------------------------------------------------------  Cardiac Enzymes No results for  input(s): CKMB, TROPONINI, MYOGLOBIN in the last 168 hours.  Invalid input(s): CK ------------------------------------------------------------------------------------------------------------------ Invalid input(s): POCBNP    ----------------------------------------------------------------------------------------------------------------  Imaging results:   Dg Chest 2 View  12/10/2014   CLINICAL DATA:  chest pain, central chest sharp in nature that spreads to left and right arm. Complaining of numbness and tingling in right hand and fingers. Also reports dizziness and shortness of breath.  EXAM: CHEST - 2 VIEW  COMPARISON:  01/01/2014  FINDINGS: Lungs are clear. Heart size and mediastinal contours are within normal limits. No effusion.  No pneumothorax. Visualized skeletal structures are unremarkable.  IMPRESSION: No acute cardiopulmonary disease.   Electronically Signed   By: Arne Cleveland M.D.   On: 12/10/2014 11:50   Ct Head Wo Contrast  12/10/2014   CLINICAL DATA:  54 year old male with dizziness and syncope since yesterday. Initial encounter.  EXAM: CT HEAD WITHOUT CONTRAST  TECHNIQUE: Contiguous axial images were obtained from the base of the skull through the vertex without intravenous contrast.  COMPARISON:  Cervical spine radiographs 03/09/2012.  FINDINGS: Scattered paranasal  sinus mucosal thickening and small mucous retention cysts. Mastoids and tympanic cavities are clear.  The frontal sinuses are hyperplastic, and there is a sclerotic lobulated right frontal sinus osteoma (series 3, image 31). This encompasses up to 25 x 15 mm axially.  No acute osseous abnormality identified. Negative orbit and scalp soft tissues.  Cerebral volume is within normal limits for age. No midline shift, ventriculomegaly, mass effect, evidence of mass lesion, intracranial hemorrhage or evidence of cortically based acute infarction. Gray-white matter differentiation is within normal limits throughout the brain. No  suspicious intracranial vascular hyperdensity.  IMPRESSION: 1.  Normal for age non contrast CT appearance of the brain. 2. 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   Electronically Signed   By: Lars Pinks M.D.   On: 12/10/2014 12:01   Mr Brain Wo Contrast  12/10/2014   CLINICAL DATA:  Dizziness.  Near syncope.  EXAM: MRI HEAD WITHOUT CONTRAST  TECHNIQUE: Multiplanar, multiecho pulse sequences of the brain and surrounding structures were obtained without intravenous contrast.  COMPARISON:  CT head 12/10/2014  FINDINGS: Ventricle size is normal. Cerebral volume is normal. Pituitary normal in size. Craniocervical junction normal.  Negative for acute infarct.  Several small hyperintensities in the deep white matter bilaterally likely due to chronic microvascular ischemia. Brainstem and cerebellum normal.  Negative for intracranial hemorrhage.  Negative for mass or edema.  Mild mucosal edema right maxillary sinus. Small mastoid sinus effusion on the right  IMPRESSION: Normal for age noncontrast CT of the head.   Electronically Signed   By: Franchot Gallo M.D.   On: 12/10/2014 15:05    My personal review of EKG: Rhythm NSR, Inferior T wave changes   Assessment & Plan    1. Hypertensive urgency with dizziness and exertional chest pain. Will be admitted to a telemetry bed, he is noncompliant with his medications, counseled, we'll initiate him on comminution of Coreg, Norvasc and hydralazine. Monitor blood pressure just medications as needed.   2. Dizziness/vertigo. Likely due to #1 above. No nystagmus. We will give him scheduled meclizine and control blood pressure, PT evaluation for vestibular rehabilitation if needed.   3. Exertional chest pain. Also likely due to hypertensive urgency. He does have T-wave inversion in inferior leads, could be due to uncontrolled blood pressure, cycle troponin, check echograms for wall motion and EF evaluation. Currently pain  pressure free.   4. Chronic back pain. Supportive care.    5. Hypokalemia. Replace.     Right-sided osteoma. Outpatient ENT follow-up.     DVT Prophylaxis  Lovenox   AM Labs Ordered, also please review Full Orders  Family Communication: Admission, patients condition and plan of care including tests being ordered have been discussed with the patient who indicates understanding and agree with the plan and Code Status.  Code Status Full  Likely DC to Home  Condition Fair  Time spent in minutes : 35    SINGH,PRASHANT K M.D on 12/10/2014 at 3:42 PM  Between 7am to 7pm - Pager - [phone]  After 7pm go to [email or web address] - [facility] Office  [phone]

## 2014-12-10 NOTE — ED Notes (Signed)
Pt presents to department for evaluation of dizziness and near syncope. Onset yesterday. Denies pain. Pt is alert and oriented x4. NAD.

## 2014-12-10 NOTE — ED Provider Notes (Signed)
CSN: [id number]     Arrival date & time 12/10/14  0943 History   First MD Initiated Contact with Patient 12/10/14 1040     Chief Complaint  Patient presents with  . Dizziness  . Near Syncope     (Consider location/radiation/quality/duration/timing/severity/associated sxs/prior Treatment) HPI Comments: He endorses lightheadedness and dizziness for the past 2 days. It is worse when he stands up and better when he sits down. He endorses a room spinning sensation and feels like he is going to pass out. He's had intermittent chest pain over the past several days it radiates to his left and right arm associated with numbness. He endorses some shortness of breath and nausea. Denies having chest pain similar to this. No cardiac history. States history of blood pressure issues but  has not taken medications in 3 months. denies any cocaine abuse. Denies any headache or vision change. Denies any fevers.  Patient is a 54 y.o. male presenting with near-syncope. The history is provided by the patient.  Near Syncope Associated symptoms include chest pain and shortness of breath. Pertinent negatives include no abdominal pain.    Past Medical History  Diagnosis Date  . Seasonal allergies   . Hypertension   . Chronic back pain   . Sciatica    History reviewed. No pertinent past surgical history. History reviewed. No pertinent family history. History  Substance Use Topics  . Smoking status: Current Every Day Smoker    Types: Cigarettes  . Smokeless tobacco: Not on file  . Alcohol Use: Yes    Review of Systems  Constitutional: Negative for fever, activity change and appetite change.  Respiratory: Positive for chest tightness and shortness of breath. Negative for cough.   Cardiovascular: Positive for chest pain and near-syncope.  Gastrointestinal: Negative for nausea, vomiting and abdominal pain.  Genitourinary: Negative for dysuria and hematuria.  Musculoskeletal: Negative for myalgias and  arthralgias.  Neurological: Positive for dizziness, weakness, light-headedness and numbness.  A complete 10 system review of systems was obtained and all systems are negative except as noted in the HPI and PMH.      Allergies  Review of patient's allergies indicates no known allergies.  Home Medications   Prior to Admission medications   Medication Sig Start Date End Date Taking? Authorizing Provider  Aspirin-Salicylamide-Caffeine (BC HEADACHE PO) Take 1 packet by mouth daily as needed (for pain).   Yes Historical Provider, MD  ibuprofen (ADVIL,MOTRIN) 800 MG tablet Take 1 tablet (800 mg total) by mouth 3 (three) times daily. 02/25/14  Yes Kaitlyn Szekalski, PA-C  meloxicam (MOBIC) 7.5 MG tablet Take 2 tablets (15 mg total) by mouth daily. 01/18/14  Yes Jennifer L Piepenbrink, PA-C  PRESCRIPTION MEDICATION Take 1 tablet by mouth daily. unknown blood pressure medication, not on file at any pharmacy   Yes Historical Provider, MD  cyclobenzaprine (FLEXERIL) 10 MG tablet Take 1 tablet (10 mg total) by mouth 2 (two) times daily as needed for muscle spasms. Patient not taking: Reported on 12/10/2014 02/25/14   Alvina Chou, PA-C  meloxicam (MOBIC) 7.5 MG tablet Take 1 tablet (7.5 mg total) by mouth daily. Patient not taking: Reported on 12/10/2014 01/01/14   Linus Mako, PA-C  methocarbamol (ROBAXIN) 500 MG tablet Take 1 tablet (500 mg total) by mouth 2 (two) times daily. Patient not taking: Reported on 12/10/2014 01/18/14   Anderson Malta L Piepenbrink, PA-C   BP 171/105 mmHg  Pulse 51  Temp(Src) 98.2 F (36.8 C) (Oral)  Resp 12  SpO2 99%  Physical Exam  Constitutional: He is oriented to person, place, and time. He appears well-developed and well-nourished. No distress.  HENT:  Head: Normocephalic and atraumatic.  Mouth/Throat: Oropharynx is clear and moist. No oropharyngeal exudate.  Eyes: Conjunctivae and EOM are normal. Pupils are equal, round, and reactive to light.  Neck: Normal range of  motion. Neck supple.  No meningismus.  Cardiovascular: Normal rate, regular rhythm, normal heart sounds and intact distal pulses.   No murmur heard. Pulmonary/Chest: Effort normal and breath sounds normal. No respiratory distress.  Abdominal: Soft. There is no tenderness. There is no rebound and no guarding.  Musculoskeletal: Normal range of motion. He exhibits no edema or tenderness.  Neurological: He is alert and oriented to person, place, and time. No cranial nerve deficit. He exhibits normal muscle tone. Coordination normal.  No ataxia on finger to nose bilaterally. No pronator drift. 5/5 strength throughout. CN 2-12 intact. Positive Romberg, ataxic gait, no nystagmus. Test of skew negative. Head impulse testing negative  Skin: Skin is warm.  Psychiatric: He has a normal mood and affect. His behavior is normal.  Nursing note and vitals reviewed.   ED Course  Procedures (including critical care time) Labs Review Labs Reviewed  COMPREHENSIVE METABOLIC PANEL - Abnormal; Notable for the following:    Potassium 3.4 (*)    Glucose, Bld 103 (*)    All other components within normal limits  CBC WITH DIFFERENTIAL/PLATELET  TROPONIN I  TROPONIN I  TROPONIN I  TROPONIN I  I-STAT TROPOININ, ED  I-STAT TROPOININ, ED    Imaging Review Dg Chest 2 View  12/10/2014   CLINICAL DATA:  chest pain, central chest sharp in nature that spreads to left and right arm. Complaining of numbness and tingling in right hand and fingers. Also reports dizziness and shortness of breath.  EXAM: CHEST - 2 VIEW  COMPARISON:  01/01/2014  FINDINGS: Lungs are clear. Heart size and mediastinal contours are within normal limits. No effusion.  No pneumothorax. Visualized skeletal structures are unremarkable.  IMPRESSION: No acute cardiopulmonary disease.   Electronically Signed   By: Arne Cleveland M.D.   On: 12/10/2014 11:50   Ct Head Wo Contrast  12/10/2014   CLINICAL DATA:  54 year old male with dizziness and syncope  since yesterday. Initial encounter.  EXAM: CT HEAD WITHOUT CONTRAST  TECHNIQUE: Contiguous axial images were obtained from the base of the skull through the vertex without intravenous contrast.  COMPARISON:  Cervical spine radiographs 03/09/2012.  FINDINGS: Scattered paranasal sinus mucosal thickening and small mucous retention cysts. Mastoids and tympanic cavities are clear.  The frontal sinuses are hyperplastic, and there is a sclerotic lobulated right frontal sinus osteoma (series 3, image 31). This encompasses up to 25 x 15 mm axially.  No acute osseous abnormality identified. Negative orbit and scalp soft tissues.  Cerebral volume is within normal limits for age. No midline shift, ventriculomegaly, mass effect, evidence of mass lesion, intracranial hemorrhage or evidence of cortically based acute infarction. Gray-white matter differentiation is within normal limits throughout the brain. No suspicious intracranial vascular hyperdensity.  IMPRESSION: 1.  Normal for age non contrast CT appearance of the brain. 2. 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   Electronically Signed   By: Lars Pinks M.D.   On: 12/10/2014 12:01   Mr Brain Wo Contrast  12/10/2014   CLINICAL DATA:  Dizziness.  Near syncope.  EXAM: MRI HEAD WITHOUT CONTRAST  TECHNIQUE: Multiplanar, multiecho pulse sequences of the brain and  surrounding structures were obtained without intravenous contrast.  COMPARISON:  CT head 12/10/2014  FINDINGS: Ventricle size is normal. Cerebral volume is normal. Pituitary normal in size. Craniocervical junction normal.  Negative for acute infarct.  Several small hyperintensities in the deep white matter bilaterally likely due to chronic microvascular ischemia. Brainstem and cerebellum normal.  Negative for intracranial hemorrhage.  Negative for mass or edema.  Mild mucosal edema right maxillary sinus. Small mastoid sinus effusion on the right  IMPRESSION: Normal for age  noncontrast CT of the head.   Electronically Signed   By: Franchot Gallo M.D.   On: 12/10/2014 15:05     EKG Interpretation   Date/Time:  Wednesday December 10 2014 09:55:16 EST Ventricular Rate:  69 PR Interval:  142 QRS Duration: 82 QT Interval:  372 QTC Calculation: 398 R Axis:   19 Text Interpretation:  Normal sinus rhythm Minimal voltage criteria for  LVH, may be normal variant Cannot rule out Anterior infarct , age  undetermined T wave abnormality, consider inferior ischemia Abnormal ECG  inferior T wave inversion Confirmed by Wyvonnia Dusky  MD, [place] [phone]) on  12/10/2014 10:57:50 AM      MDM   Final diagnoses:  Hypertensive urgency  Chest pain, unspecified chest pain type   Episode of chest pain over the past several days, worse with exertion, radiation to bilateral arms.  Dizziness described as vertigo that is worse with standing.  No focal weakness  EKG nsr, new T wave inversion laterally. Hypertensive.  Check CT head, concern for infarct given elevated BP. Will allow permissive hypertension.  CT head negative.  Troponin negative.  With uncontrolled BP and EKG changes, will admit for chest pain rule out and hypertensive urgency.  MRI negative for stroke. D/w Dr. Candiss Norse.   Ezequiel Essex, MD 12/10/14 [phone]

## 2014-12-11 ENCOUNTER — Encounter (HOSPITAL_COMMUNITY): Payer: Self-pay | Admitting: Cardiovascular Disease

## 2014-12-11 DIAGNOSIS — I200 Unstable angina: Secondary | ICD-10-CM

## 2014-12-11 DIAGNOSIS — R072 Precordial pain: Secondary | ICD-10-CM

## 2014-12-11 DIAGNOSIS — I10 Essential (primary) hypertension: Secondary | ICD-10-CM

## 2014-12-11 LAB — BASIC METABOLIC PANEL
Anion gap: 9 (ref 5–15)
BUN: 10 mg/dL (ref 6–23)
CALCIUM: 9.4 mg/dL (ref 8.4–10.5)
CHLORIDE: 108 mmol/L (ref 96–112)
CO2: 24 mmol/L (ref 19–32)
Creatinine, Ser: 0.79 mg/dL (ref 0.50–1.35)
GFR calc Af Amer: >90 mL/min (ref >90)
GFR calc non Af Amer: >90 mL/min (ref >90)
Glucose, Bld: 95 mg/dL (ref 70–99)
Potassium: 4.3 mmol/L (ref 3.5–5.1)
SODIUM: 141 mmol/L (ref 135–145)

## 2014-12-11 LAB — CBC
HCT: 44.6 % (ref 39.0–52.0)
Hemoglobin: 15.4 g/dL (ref 13.0–17.0)
MCH: 31 pg (ref 26.0–34.0)
MCHC: 34.5 g/dL (ref 30.0–36.0)
MCV: 89.7 fL (ref 78.0–100.0)
Platelets: 240 10*3/uL (ref 150–400)
RBC: 4.97 MIL/uL (ref 4.22–5.81)
RDW: 13.8 % (ref 11.5–15.5)
WBC: 7.5 10*3/uL (ref 4.0–10.5)

## 2014-12-11 LAB — PROTIME-INR
INR: 0.96 (ref 0.00–1.49)
PROTHROMBIN TIME: 12.8 s (ref 11.6–15.2)

## 2014-12-11 LAB — TROPONIN I: Troponin I: <0.03 ng/mL (ref <0.031)

## 2014-12-11 MED ORDER — SODIUM CHLORIDE 0.9 % IV SOLN
INTRAVENOUS | Status: AC
  Administered 2014-12-12: 04:00:00 via INTRAVENOUS

## 2014-12-11 MED ORDER — SODIUM CHLORIDE 0.9 % IJ SOLN: 3.0000 mL | INTRAMUSCULAR | Status: DC | PRN

## 2014-12-11 MED ORDER — SODIUM CHLORIDE 0.9 % IV SOLN
INTRAVENOUS | Status: DC
  Administered 2014-12-11: 17:00:00 via INTRAVENOUS

## 2014-12-11 MED ORDER — SODIUM CHLORIDE 0.9 % IV SOLN: 250.0000 mL | INTRAVENOUS | Status: DC | PRN

## 2014-12-11 MED ORDER — ASPIRIN 81 MG PO CHEW
81.0000 mg | CHEWABLE_TABLET | ORAL | Status: AC
  Administered 2014-12-12: 81 mg via ORAL
  Filled 2014-12-11: qty 1

## 2014-12-11 MED ORDER — ASPIRIN 81 MG PO CHEW: 81.0000 mg | CHEWABLE_TABLET | Freq: Every day | ORAL | Status: DC

## 2014-12-11 MED ORDER — ZOLPIDEM TARTRATE 5 MG PO TABS
5.0000 mg | ORAL_TABLET | Freq: Once | ORAL | Status: AC
  Administered 2014-12-11: 21:00:00 5 mg via ORAL
  Filled 2014-12-11: qty 1

## 2014-12-11 MED ORDER — SODIUM CHLORIDE 0.9 % IV BOLUS (SEPSIS): 1000.0000 mL | Freq: Once | INTRAVENOUS | Status: DC

## 2014-12-11 MED ORDER — SODIUM CHLORIDE 0.9 % IJ SOLN
3.0000 mL | Freq: Two times a day (BID) | INTRAMUSCULAR | Status: DC
  Administered 2014-12-12: 11:00:00 3 mL via INTRAVENOUS

## 2014-12-11 NOTE — Progress Notes (Addendum)
PROGRESS NOTE  Bradley [name] YCX:[id number] DOB: 07-22-1961 DOA: 12/10/2014 PCP: No PCP Per Patient  Brief history 54 year old male with a history of hypertension and chronic back pain presented with 3-4 day history of dizziness and exertional chest discomfort. The patient states that he was previously on antihypertensive medications, but has not taken his medications in approximately 3 months. Over the past several weeks, he has had some intermittent chest discomfort with shortness of breath and dizziness. Because of worsening of his symptoms, the patient presented to emergency Department. Orthostatic vital signs negative. The patient was noted to have a blood pressure 197/112. Assessment/Plan: Chest Pain -may be related to his uncontrolled HTN -in the setting of his risk factors and exertional nature of his symptoms and abnormal EKG, consult cardiology for opinion on stress test -Troponins negative 3 and EKG with T-wave inversion in lead III, aVF -continue ASA Dizziness -seems to be positional - orthostatic vital signs negative -MRI brain negative for acute findings -Echocardiogram EF 65-70%, no wall motion abnormalities -Although the patient's orthostatic vital signs are negative, we'll try a fluid challenge to see if this makes any difference -Antivert when necessary Hypertensive urgency -Continue carvedilol, hydralazine, amlodipine -UDS   Family Communication:   Pt at beside Disposition Plan:   Home when medically stable        Procedures/Studies: Dg Chest 2 View  12/10/2014   CLINICAL DATA:  chest pain, central chest sharp in nature that spreads to left and right arm. Complaining of numbness and tingling in right hand and fingers. Also reports dizziness and shortness of breath.  EXAM: CHEST - 2 VIEW  COMPARISON:  01/01/2014  FINDINGS: Lungs are clear. Heart size and mediastinal contours are within normal limits. No effusion.  No pneumothorax. Visualized skeletal  structures are unremarkable.  IMPRESSION: No acute cardiopulmonary disease.   Electronically Signed   By: Arne Cleveland M.D.   On: 12/10/2014 11:50   Ct Head Wo Contrast  12/10/2014   CLINICAL DATA:  54 year old male with dizziness and syncope since yesterday. Initial encounter.  EXAM: CT HEAD WITHOUT CONTRAST  TECHNIQUE: Contiguous axial images were obtained from the base of the skull through the vertex without intravenous contrast.  COMPARISON:  Cervical spine radiographs 03/09/2012.  FINDINGS: Scattered paranasal sinus mucosal thickening and small mucous retention cysts. Mastoids and tympanic cavities are clear.  The frontal sinuses are hyperplastic, and there is a sclerotic lobulated right frontal sinus osteoma (series 3, image 31). This encompasses up to 25 x 15 mm axially.  No acute osseous abnormality identified. Negative orbit and scalp soft tissues.  Cerebral volume is within normal limits for age. No midline shift, ventriculomegaly, mass effect, evidence of mass lesion, intracranial hemorrhage or evidence of cortically based acute infarction. Gray-white matter differentiation is within normal limits throughout the brain. No suspicious intracranial vascular hyperdensity.  IMPRESSION: 1.  Normal for age non contrast CT appearance of the brain. 2. 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   Electronically Signed   By: Lars Pinks M.D.   On: 12/10/2014 12:01   Mr Brain Wo Contrast  12/10/2014   CLINICAL DATA:  Dizziness.  Near syncope.  EXAM: MRI HEAD WITHOUT CONTRAST  TECHNIQUE: Multiplanar, multiecho pulse sequences of the brain and surrounding structures were obtained without intravenous contrast.  COMPARISON:  CT head 12/10/2014  FINDINGS: Ventricle size is normal. Cerebral volume is normal. Pituitary normal in size. Craniocervical junction normal.  Negative  for acute infarct.  Several small hyperintensities in the deep white matter bilaterally likely due to  chronic microvascular ischemia. Brainstem and cerebellum normal.  Negative for intracranial hemorrhage.  Negative for mass or edema.  Mild mucosal edema right maxillary sinus. Small mastoid sinus effusion on the right  IMPRESSION: Normal for age noncontrast CT of the head.   Electronically Signed   By: Franchot Gallo M.D.   On: 12/10/2014 15:05         Subjective: continues to complain of some dizziness although better than it was yesterday. Denies any headache, visual disturbance, chest pain, shortness breath, nausea, vomiting, diarrhea, abdominal pain, dysuria.   Objective: Filed Vitals:   12/11/14 0800 12/11/14 0825 12/11/14 0830 12/11/14 1224  BP:  141/92 134/89 148/101  Pulse:   84 80  Temp: 97.8 F (36.6 C)   98 F (36.7 C)  TempSrc: Oral   Oral  Resp:    16  Height:      Weight:      SpO2: 100%  100% 98%    Intake/Output Summary (Last 24 hours) at 12/11/14 1334 Last data filed at 12/11/14 1305  Gross per 24 hour  Intake    720 ml  Output    400 ml  Net    320 ml   Weight change:  Exam:   General:  Pt is alert, follows commands appropriately, not in acute distress  HEENT: No icterus, No thrush, [place]/AT  Cardiovascular: RRR, S1/S2, no rubs, no gallops  Respiratory: CTA bilaterally, no wheezing, no crackles, no rhonchi  Abdomen: Soft/+BS, non tender, non distended, no guarding  Extremities: No edema, No lymphangitis, No petechiae, No rashes, no synovitis  Data Reviewed: Basic Metabolic Panel:  Recent Labs Lab 12/10/14 0958 12/11/14 0500  NA 141 141  K 3.4* 4.3  CL 108 108  CO2 27 24  GLUCOSE 103* 95  BUN 9 10  CREATININE 0.82 0.79  CALCIUM 8.9 9.4   Liver Function Tests:  Recent Labs Lab 12/10/14 0958  AST 20  ALT 19  ALKPHOS 77  BILITOT 0.5  PROT 6.8  ALBUMIN 3.8   No results for input(s): LIPASE, AMYLASE in the last 168 hours. No results for input(s): AMMONIA in the last 168 hours. CBC:  Recent Labs Lab 12/10/14 0958  12/11/14 0500  WBC 6.1 7.5  NEUTROABS 3.6  --   HGB 14.6 15.4  HCT 41.9 44.6  MCV 90.3 89.7  PLT 213 240   Cardiac Enzymes:  Recent Labs Lab 12/10/14 1522 12/10/14 1954 12/11/14 0502  TROPONINI <0.03 <0.03 <0.03   BNP: Invalid input(s): POCBNP CBG: No results for input(s): GLUCAP in the last 168 hours.  Recent Results (from the past 240 hour(s))  MRSA PCR Screening     Status: None   Collection Time: 12/10/14  7:29 PM  Result Value Ref Range Status   MRSA by PCR NEGATIVE NEGATIVE Final    Comment:        The GeneXpert MRSA Assay (FDA approved for NASAL specimens only), is one component of a comprehensive MRSA colonization surveillance program. It is not intended to diagnose MRSA infection nor to guide or monitor treatment for MRSA infections.      Scheduled Meds: . amLODipine  10 mg Oral Daily  . aspirin  81 mg Oral Daily  . carvedilol  6.25 mg Oral BID WC  . enoxaparin (LOVENOX) injection  40 mg Subcutaneous Q24H  . hydrALAZINE  50 mg Oral 3 times per day  .  meclizine  25 mg Oral TID  . sodium chloride  3 mL Intravenous Q12H   Continuous Infusions:    Mirabelle Cyphers, DO  [facility] Pager [phone]  If 7PM-7AM, please contact night-coverage [email or web address] Password [email or web address] 12/11/2014, 1:34 PM   LOS: 1 day \

## 2014-12-11 NOTE — Care Management Note (Unsigned)
    Page 1 of 1   12/11/2014     11:21:20 AM CARE MANAGEMENT NOTE 12/11/2014  Patient:  Bradley [name], Bradley [name]   Account Number:  [id number]  Date Initiated:  12/11/2014  Documentation initiated by:  Kolette Vey  Subjective/Objective Assessment:   Pt adm on 12/10/14 with HTN urgency.  PTA, pt independent of ADLS.     Action/Plan:   No PCP, per pt.  Will follow up with pt regarding PCP follow up.   Anticipated DC Date:  12/12/2014   Anticipated DC Plan:  [place]  CM consult      Choice offered to / List presented to:             Status of service:  In process, will continue to follow Medicare Important Message given?  NO (If response is "NO", the following Medicare IM given date fields will be blank) Date Medicare IM given:   Medicare IM given by:   Date Additional Medicare IM given:   Additional Medicare IM given by:    Discharge Disposition:    Per UR Regulation:  Reviewed for med. necessity/level of care/duration of stay  If discussed at [place] of Stay Meetings, dates discussed:    Comments:

## 2014-12-11 NOTE — Consult Note (Signed)
Patient ID: Bradley [name] MRN: [id number] DOB/AGE: 06-19-1961 54 y.o.  Admit date: 12/10/2014 Referring Physician: Tat Primary Cardiologist: New Reason for Consultation: Chest pain  HPI: 54 yo male with history of HTN, tobacco abuse admitted with hypertensive urgency and c/o dizziness, chest pain. He has no documented CAD. No prior ischemic evaluation. He has had exertional left sided chest pain with radiation to both arms for last two months. Associated dyspnea. Each episode lasts for 5 minutes and resolves with rest. He has smoked 1 ppd for last 40 years with most recent use of 1/2 ppd. Blood pressure elevated at 197/112 on admission. Echo with normal LV size and function. EKG with inferior T wave inversions. He does note very strong family history of CAD in father, mother and sister.   No chest pain today at rest. No dyspnea.    Past Medical History  Diagnosis Date  . Seasonal allergies   . Hypertension   . Chronic back pain   . Sciatica   . Kidney stones   . Chronic sinusitis   . Arthritis     "lower back by by butt; right shoulder" (12/10/2014)  . Tobacco abuse     Family History  Problem Relation Age of Onset  . Coronary artery disease Father   . Coronary artery disease Mother   . Coronary artery disease Sister     History   Social History  . Marital Status: Divorced    Spouse Name: N/A    Number of Children: N/A  . Years of Education: N/A   Occupational History  . Not on file.   Social History Main Topics  . Smoking status: Current Every Day Smoker -- 1.00 packs/day for 44 years    Types: Cigarettes  . Smokeless tobacco: Never Used  . Alcohol Use: 2.4 oz/week    4 Cans of beer per week  . Drug Use: Yes    Special: Marijuana     Comment: 12/10/2014 "couple times/wk"  . Sexual Activity: Not Currently   Other Topics Concern  . Not on file   Social History Narrative    Past Surgical History  Procedure Laterality Date  . Kidney stone surgery  ~ 1980      "laser"  . Incision and drainage abscess      "boils; under right arm, hip, side"    No Known Allergies   Hospital Medications:  . amLODipine  10 mg Oral Daily  . aspirin  81 mg Oral Daily  . carvedilol  6.25 mg Oral BID WC  . enoxaparin (LOVENOX) injection  40 mg Subcutaneous Q24H  . hydrALAZINE  50 mg Oral 3 times per day  . meclizine  25 mg Oral TID  . sodium chloride  3 mL Intravenous Q12H   Prior to Admission medications   Medication Sig Start Date End Date Taking? Authorizing Provider  Aspirin-Salicylamide-Caffeine (BC HEADACHE PO) Take 1 packet by mouth daily as needed (for pain).   Yes Historical Provider, MD  ibuprofen (ADVIL,MOTRIN) 800 MG tablet Take 1 tablet (800 mg total) by mouth 3 (three) times daily. 02/25/14  Yes Kaitlyn Szekalski, PA-C  meloxicam (MOBIC) 7.5 MG tablet Take 2 tablets (15 mg total) by mouth daily. 01/18/14  Yes Jennifer L Piepenbrink, PA-C  PRESCRIPTION MEDICATION Take 1 tablet by mouth daily. unknown blood pressure medication, not on file at any pharmacy   Yes Historical Provider, MD  cyclobenzaprine (FLEXERIL) 10 MG tablet Take 1 tablet (10 mg total) by mouth 2 (  two) times daily as needed for muscle spasms. Patient not taking: Reported on 12/10/2014 02/25/14   Alvina Chou, PA-C  meloxicam (MOBIC) 7.5 MG tablet Take 1 tablet (7.5 mg total) by mouth daily. Patient not taking: Reported on 12/10/2014 01/01/14   Linus Mako, PA-C  methocarbamol (ROBAXIN) 500 MG tablet Take 1 tablet (500 mg total) by mouth 2 (two) times daily. Patient not taking: Reported on 12/10/2014 01/18/14   Stephani Police Piepenbrink, PA-C    Review of systems complete and found to be negative unless listed above    Physical Exam: Blood pressure 158/119, pulse 90, temperature 98 F (36.7 C), temperature source Oral, resp. rate 16, height 6' (1.829 m), weight 178 lb 12.7 oz (81.1 kg), SpO2 100 %.    General: Well developed, well nourished, NAD  HEENT: OP clear, mucus membranes  moist  SKIN: warm, dry. No rashes.  Neuro: No focal deficits  Musculoskeletal: Muscle strength 5/5 all ext  Psychiatric: Mood and affect normal  Neck: No JVD, no carotid bruits, no thyromegaly, no lymphadenopathy.  Lungs:Clear bilaterally, no wheezes, rhonci, crackles  Cardiovascular: Regular rate and rhythm. No murmurs, gallops or rubs.  Abdomen:Soft. Bowel sounds present. Non-tender.  Extremities: No lower extremity edema. Pulses are 2 + in the bilateral DP/PT.  Labs:   Lab Results  Component Value Date   WBC 7.5 12/11/2014   HGB 15.4 12/11/2014   HCT 44.6 12/11/2014   MCV 89.7 12/11/2014   PLT 240 12/11/2014     Recent Labs Lab 12/10/14 0958 12/11/14 0500  NA 141 141  K 3.4* 4.3  CL 108 108  CO2 27 24  BUN 9 10  CREATININE 0.82 0.79  CALCIUM 8.9 9.4  PROT 6.8  --   BILITOT 0.5  --   ALKPHOS 77  --   ALT 19  --   AST 20  --   GLUCOSE 103* 95   Lab Results  Component Value Date   TROPONINI <0.03 12/11/2014    Echo 12/11/14: Left ventricle: The cavity size was normal. Wall thickness was normal. Systolic function was vigorous. The estimated ejection fraction was in the range of 65% to 70%. Wall motion was normal; there were no regional wall motion abnormalities. - Left atrium: The atrium was mildly dilated. - Atrial septum: No defect or patent foramen ovale was identified. - Pulmonary arteries: PA peak pressure: 37 mm Hg (S).   Chest x-ray:  Lungs are clear. Heart size and mediastinal contours are within normal limits. No effusion. No pneumothorax. Visualized skeletal structures are unremarkable.  IMPRESSION: No acute cardiopulmonary disease.  EKG: sinus, inferior TWI  ASSESSMENT AND PLAN:   1. Unstable angina: He has a good story for angina. EKG shows T wave inversions. Risk factors for CAD include HTN, tobacco abuse and strong family history of CAD. Will plan cardiac cath 12/12/14. Risks and benefits reviewed. He agrees to proceed.   2. HTN: BP  control per primary team.    Signed: Lauree Chandler, MD 12/11/2014, 3:29 PM

## 2014-12-11 NOTE — Progress Notes (Signed)
  Echocardiogram 2D Echocardiogram has been performed.  Bradley [name] 12/11/2014, 9:25 AM

## 2014-12-11 NOTE — Care Management Utilization Note (Signed)
UR Completed.    Aaditya Letizia Wise Evanna Washinton, RN, BSN Phone #[phone] 

## 2014-12-12 ENCOUNTER — Encounter (HOSPITAL_COMMUNITY)
Admission: EM | Disposition: A | Discharge status: Home / Self Care | Payer: Self-pay | Source: Home / Self Care | Attending: Emergency Medicine

## 2014-12-12 ENCOUNTER — Encounter (HOSPITAL_COMMUNITY): Payer: Self-pay | Admitting: Cardiovascular Disease

## 2014-12-12 DIAGNOSIS — R0602 Shortness of breath: Secondary | ICD-10-CM | POA: Diagnosis not present

## 2014-12-12 DIAGNOSIS — R0789 Other chest pain: Secondary | ICD-10-CM | POA: Diagnosis not present

## 2014-12-12 DIAGNOSIS — I2510 Atherosclerotic heart disease of native coronary artery without angina pectoris: Secondary | ICD-10-CM

## 2014-12-12 DIAGNOSIS — R42 Dizziness and giddiness: Secondary | ICD-10-CM | POA: Diagnosis not present

## 2014-12-12 DIAGNOSIS — I10 Essential (primary) hypertension: Secondary | ICD-10-CM | POA: Diagnosis not present

## 2014-12-12 HISTORY — PX: LEFT HEART CATHETERIZATION WITH CORONARY ANGIOGRAM: SHX5451

## 2014-12-12 LAB — URINALYSIS, ROUTINE W REFLEX MICROSCOPIC
Bilirubin Urine: NEGATIVE
Glucose, UA: NEGATIVE mg/dL
Hgb urine dipstick: NEGATIVE
Ketones, ur: NEGATIVE mg/dL
LEUKOCYTES UA: NEGATIVE
Nitrite: NEGATIVE
PH: 6 (ref 5.0–8.0)
Protein, ur: NEGATIVE mg/dL
SPECIFIC GRAVITY, URINE: 1.015 (ref 1.005–1.030)
Urobilinogen, UA: 0.2 mg/dL (ref 0.0–1.0)

## 2014-12-12 LAB — CK TOTAL AND CKMB (NOT AT ARMC)
CK TOTAL: 57 U/L (ref 7–232)
CK, MB: 0.7 ng/mL (ref 0.3–4.0)
Relative Index: INVALID (ref 0.0–2.5)

## 2014-12-12 LAB — RAPID URINE DRUG SCREEN, HOSP PERFORMED
AMPHETAMINES: NOT DETECTED
BARBITURATES: NOT DETECTED
Benzodiazepines: NOT DETECTED
Cocaine: NOT DETECTED
OPIATES: POSITIVE — AB
Tetrahydrocannabinol: POSITIVE — AB

## 2014-12-12 SURGERY — LEFT HEART CATHETERIZATION WITH CORONARY ANGIOGRAM
Anesthesia: LOCAL

## 2014-12-12 MED ORDER — SODIUM CHLORIDE 0.9 % IV SOLN
INTRAVENOUS | Status: DC
  Administered 2014-12-12: 17:00:00 via INTRAVENOUS

## 2014-12-12 MED ORDER — HEPARIN (PORCINE) IN NACL 2-0.9 UNIT/ML-% IJ SOLN
INTRAMUSCULAR | Status: AC
  Filled 2014-12-12: qty 1000

## 2014-12-12 MED ORDER — FENTANYL CITRATE 0.05 MG/ML IJ SOLN
INTRAMUSCULAR | Status: AC
  Filled 2014-12-12: qty 2

## 2014-12-12 MED ORDER — METOPROLOL TARTRATE 25 MG PO TABS: 25.0000 mg | ORAL_TABLET | Freq: Two times a day (BID) | ORAL | Status: DC

## 2014-12-12 MED ORDER — LIDOCAINE HCL (PF) 1 % IJ SOLN
INTRAMUSCULAR | Status: AC
  Filled 2014-12-12: qty 30

## 2014-12-12 MED ORDER — HYDRALAZINE HCL 50 MG PO TABS
50.0000 mg | ORAL_TABLET | Freq: Three times a day (TID) | ORAL | Status: DC
Start: 2014-12-12 — End: 2016-09-06

## 2014-12-12 MED ORDER — SODIUM CHLORIDE 0.9 % IV SOLN: INTRAVENOUS | Status: DC

## 2014-12-12 MED ORDER — HEPARIN SODIUM (PORCINE) 1000 UNIT/ML IJ SOLN
INTRAMUSCULAR | Status: AC
  Filled 2014-12-12: qty 1

## 2014-12-12 MED ORDER — ASPIRIN 81 MG PO CHEW: 81.0000 mg | CHEWABLE_TABLET | Freq: Every day | ORAL | Status: DC

## 2014-12-12 MED ORDER — MECLIZINE HCL 25 MG PO TABS: 25.0000 mg | ORAL_TABLET | Freq: Three times a day (TID) | ORAL | Status: DC | PRN

## 2014-12-12 MED ORDER — MIDAZOLAM HCL 2 MG/2ML IJ SOLN
INTRAMUSCULAR | Status: AC
  Filled 2014-12-12: qty 2

## 2014-12-12 MED ORDER — VERAPAMIL HCL 2.5 MG/ML IV SOLN
INTRAVENOUS | Status: AC
  Filled 2014-12-12: qty 2

## 2014-12-12 MED ORDER — NITROGLYCERIN 1 MG/10 ML FOR IR/CATH LAB
INTRA_ARTERIAL | Status: AC
  Filled 2014-12-12: qty 10

## 2014-12-12 MED ORDER — AMLODIPINE BESYLATE 10 MG PO TABS: 10.0000 mg | ORAL_TABLET | Freq: Every day | ORAL | Status: DC

## 2014-12-12 NOTE — Research (Signed)
BIOFLOW Informed Consent   Subject Name: Bradley [name]  Subject met inclusion and exclusion criteria.  The informed consent form, study requirements and expectations were reviewed with the subject and questions and concerns were addressed prior to the signing of the consent form.  The subject verbalized understanding of the trail requirements.  The subject agreed to participate in the BIOFLOW trial and signed the informed consent.  The informed consent was obtained prior to performance of any protocol-specific procedures for the subject.  A copy of the signed informed consent was given to the subject and a copy was placed in the subject's medical record.  [name] 12/12/2014, 10:15 AM

## 2014-12-12 NOTE — Evaluation (Signed)
Physical Therapy Evaluation Patient Details Name: Bradley [name] MRN: [id number] DOB: 1961/09/18 Today's Date: 12/12/2014   History of Present Illness  54 y.o. male admitted to [facility] on 12/10/14 with 4-5 day h/o dizziness and exertional chest pressure.  Pt found in ED to be in HTN urgency.  CT (CT did show a large, but "benign-appearing R frontal sinus osteoma)and MRI are negative for stroke.  Pt underwent cardiac cath on 12/12/14 which concluded pt had mild, non-obstructive CAD.  Pt with significant PMHx of HTN, chronic back pain, sciatica, and chronic sinusitis.  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  He reports sinus drainage (chronic) decreased hearing and feeling of fullness on the right ear.  Positional vertigo testing all negative for BPPV.  He did have Meclizine today, which may be masking some of his symptoms, so I asked the RN to hold his PM and tomorrow AM doses until after we get to see him again from PT (if he is still here tomorrow).  He is unsteady on his feet requiring min guard assist.  He would benefit from f/u testing and therapy at an outpatient center for vestibular dysfunction.   PT to follow acutely for deficits listed below.       Follow Up Recommendations Outpatient PT;Other (comment) (for vestibular rehab/balance- Neurorehabilitation center)    Equipment Recommendations  None recommended by PT    Recommendations for Other Services   NA    Precautions / Restrictions Precautions Precautions: Fall Precaution Comments: pt is unsteady on his feet      Mobility  Bed Mobility Overal bed mobility: Modified Independent             General bed mobility comments: uses railing to pull to sitting.   Transfers Overall transfer level: Needs assistance Equipment used: 1 person hand held assist Transfers: Sit to/from Stand Sit to Stand: Min guard         General  transfer comment: Min guard assist for safety to steady himself during transitions.  Had pt incorporate target finding to assist with gaze stability during transitions.   Ambulation/Gait Ambulation/Gait assistance: Min assist Ambulation Distance (Feet): 15 Feet Assistive device: 1 person hand held assist Gait Pattern/deviations: Step-through pattern;Staggering left;Staggering right     General Gait Details: Pt still hooke up for post-cath continuous monitoring, so pt only ambulated in the room.  Staggering gait pattern requiring min hand held assist to maintain his balance.          Balance Overall balance assessment: Needs assistance Sitting-balance support: Feet supported;No upper extremity supported Sitting balance-Leahy Scale: Good Sitting balance - Comments: Pt dizzy when he first came to sitting, but dissipated after ~ 1 minute and with use of visual target.    Standing balance support: Single extremity supported Standing balance-Leahy Scale: Fair                               Pertinent Vitals/Pain Pain Assessment: Faces Faces Pain Scale: Hurts little more Pain Location: right sided headache Pain Descriptors / Indicators: Aching;Throbbing Pain Intervention(s): Limited activity within patient's tolerance;Monitored during session;Premedicated before session    Home Living Family/patient expects to be discharged to:: Private residence Living Arrangements: Other relatives (sister-she does not work) Available Help at Discharge: Family;Available 24 hours/day Type of Home: House         Home Equipment: None  Prior Function Level of Independence: Independent         Comments: pt works at [employer] Extremity Assessment: Overall [facility] for tasks assessed           Lower Extremity Assessment: Overall WFL for tasks assessed      Cervical / Trunk Assessment: Other exceptions  Communication    Communication: No difficulties  Cognition Arousal/Alertness: Awake/alert Behavior During Therapy: WFL for tasks assessed/performed Overall Cognitive Status: Within Functional Limits for tasks assessed                      General Comments General comments (skin integrity, edema, etc.): Vestibular assessment completed, however, he has had Meclazine today.  I asked RN to hold Meclazine from now until PT returns in AM to re assess him (if he is still here in AM).  Pt with (+) HA associated with this vertigo episode, chronic sinus drainage, sensation that his right ear is full, decreased hearing with gross hearing test on the right.  He has no report of recent head trauma or ringing in his ears.  He wears glasses for reading and has increased blurry vision (no double vision) with this episode.  Roll test (-) for symptoms and nystagmus bil, dix hall pike negative for symptoms or nystagmus bil, occulomotor tracking negative for nystagmus or saccades and also did not elicit symptoms.  VOR head shaking both vertically and horizontally were (+) for symptoms of dizziness and was very difficult for him to do (to maintain focus on the target).  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  We will continue to treat with compensations and initiate x 1 gaze stability exercises.            Assessment/Plan    PT Assessment Patient needs continued PT services  PT Diagnosis Difficulty walking;Abnormality of gait;Generalized weakness   PT Problem List Decreased activity tolerance;Decreased balance;Decreased mobility;Pain  PT Treatment Interventions DME instruction;Gait training;Stair training;Functional mobility training;Therapeutic activities;Therapeutic exercise;Balance training;Neuromuscular re-education;Patient/family education   PT Goals (Current goals can be found in the Care Plan section) Acute Rehab PT Goals Patient Stated Goal: to feel better and stop having this feeling when  he moves PT Goal Formulation: With patient Time For Goal Achievement: 12/26/14 Potential to Achieve Goals: Good    Frequency Min 4X/week           End of Session   Activity Tolerance: Other (comment) (limited by dizziness during transitions)   Nurse Communication: Other (comment) (please hold Meclazine until after PT tomorrow. )         Time: 8832-5498 PT Time Calculation (min) (ACUTE ONLY): 29 min   Charges:   PT Evaluation $Initial PT Evaluation Tier I: 1 Procedure PT Treatments $Therapeutic Activity: 8-22 mins        Brie Eppard B. [place], [place], DPT [phone]   12/12/2014, 5:37 PM

## 2014-12-12 NOTE — Interval H&P Note (Signed)
History and Physical Interval Note:  12/12/2014 1:04 PM  Bradley [name]  has presented today for cardiac cath with the diagnosis of unstable angina.  The various methods of treatment have been discussed with the patient and family. After consideration of risks, benefits and other options for treatment, the patient has consented to  Procedure(s): LEFT HEART CATHETERIZATION WITH CORONARY ANGIOGRAM (N/A) as a surgical intervention .  The patient's history has been reviewed, patient examined, no change in status, stable for surgery.  I have reviewed the patient's chart and labs.  Questions were answered to the patient's satisfaction.    Cath Lab Visit (complete for each Cath Lab visit)  Clinical Evaluation Leading to the Procedure:   ACS: No.  Non-ACS:    Anginal Classification: CCS III  Anti-ischemic medical therapy: No Therapy  Non-Invasive Test Results: No non-invasive testing performed  Prior CABG: No previous CABG        MCALHANY,CHRISTOPHER

## 2014-12-12 NOTE — Progress Notes (Signed)
TR BAND REMOVAL  LOCATION:    right radial  DEFLATED PER PROTOCOL:    Yes.    TIME BAND OFF / DRESSING APPLIED:    1545   SITE UPON ARRIVAL:    Level 0  SITE AFTER BAND REMOVAL:    Level 0  REVERSE ALLEN'S TEST:     positive  CIRCULATION SENSATION AND MOVEMENT:    Within Normal Limits   Yes.    COMMENTS:   Tolerated procedure well

## 2014-12-12 NOTE — H&P (View-Only) (Signed)
Patient ID: Bradley [name] MRN: [id number] DOB/AGE: December 29, 1960 54 y.o.  Admit date: 12/10/2014 Referring Physician: Tat Primary Cardiologist: New Reason for Consultation: Chest pain  HPI: 54 yo male with history of HTN, tobacco abuse admitted with hypertensive urgency and c/o dizziness, chest pain. He has no documented CAD. No prior ischemic evaluation. He has had exertional left sided chest pain with radiation to both arms for last two months. Associated dyspnea. Each episode lasts for 5 minutes and resolves with rest. He has smoked 1 ppd for last 40 years with most recent use of 1/2 ppd. Blood pressure elevated at 197/112 on admission. Echo with normal LV size and function. EKG with inferior T wave inversions. He does note very strong family history of CAD in father, mother and sister.   No chest pain today at rest. No dyspnea.    Past Medical History  Diagnosis Date  . Seasonal allergies   . Hypertension   . Chronic back pain   . Sciatica   . Kidney stones   . Chronic sinusitis   . Arthritis     "lower back by by butt; right shoulder" (12/10/2014)  . Tobacco abuse     Family History  Problem Relation Age of Onset  . Coronary artery disease Father   . Coronary artery disease Mother   . Coronary artery disease Sister     History   Social History  . Marital Status: Divorced    Spouse Name: N/A    Number of Children: N/A  . Years of Education: N/A   Occupational History  . Not on file.   Social History Main Topics  . Smoking status: Current Every Day Smoker -- 1.00 packs/day for 44 years    Types: Cigarettes  . Smokeless tobacco: Never Used  . Alcohol Use: 2.4 oz/week    4 Cans of beer per week  . Drug Use: Yes    Special: Marijuana     Comment: 12/10/2014 "couple times/wk"  . Sexual Activity: Not Currently   Other Topics Concern  . Not on file   Social History Narrative    Past Surgical History  Procedure Laterality Date  . Kidney stone surgery  ~ 1980      "laser"  . Incision and drainage abscess      "boils; under right arm, hip, side"    No Known Allergies   Hospital Medications:  . amLODipine  10 mg Oral Daily  . aspirin  81 mg Oral Daily  . carvedilol  6.25 mg Oral BID WC  . enoxaparin (LOVENOX) injection  40 mg Subcutaneous Q24H  . hydrALAZINE  50 mg Oral 3 times per day  . meclizine  25 mg Oral TID  . sodium chloride  3 mL Intravenous Q12H   Prior to Admission medications   Medication Sig Start Date End Date Taking? Authorizing Provider  Aspirin-Salicylamide-Caffeine (BC HEADACHE PO) Take 1 packet by mouth daily as needed (for pain).   Yes Historical Provider, MD  ibuprofen (ADVIL,MOTRIN) 800 MG tablet Take 1 tablet (800 mg total) by mouth 3 (three) times daily. 02/25/14  Yes Kaitlyn Szekalski, PA-C  meloxicam (MOBIC) 7.5 MG tablet Take 2 tablets (15 mg total) by mouth daily. 01/18/14  Yes Jennifer L Piepenbrink, PA-C  PRESCRIPTION MEDICATION Take 1 tablet by mouth daily. unknown blood pressure medication, not on file at any pharmacy   Yes Historical Provider, MD  cyclobenzaprine (FLEXERIL) 10 MG tablet Take 1 tablet (10 mg total) by mouth 2 (  two) times daily as needed for muscle spasms. Patient not taking: Reported on 12/10/2014 02/25/14   Alvina Chou, PA-C  meloxicam (MOBIC) 7.5 MG tablet Take 1 tablet (7.5 mg total) by mouth daily. Patient not taking: Reported on 12/10/2014 01/01/14   Linus Mako, PA-C  methocarbamol (ROBAXIN) 500 MG tablet Take 1 tablet (500 mg total) by mouth 2 (two) times daily. Patient not taking: Reported on 12/10/2014 01/18/14   Stephani Police Piepenbrink, PA-C    Review of systems complete and found to be negative unless listed above    Physical Exam: Blood pressure 158/119, pulse 90, temperature 98 F (36.7 C), temperature source Oral, resp. rate 16, height 6' (1.829 m), weight 178 lb 12.7 oz (81.1 kg), SpO2 100 %.    General: Well developed, well nourished, NAD  HEENT: OP clear, mucus membranes  moist  SKIN: warm, dry. No rashes.  Neuro: No focal deficits  Musculoskeletal: Muscle strength 5/5 all ext  Psychiatric: Mood and affect normal  Neck: No JVD, no carotid bruits, no thyromegaly, no lymphadenopathy.  Lungs:Clear bilaterally, no wheezes, rhonci, crackles  Cardiovascular: Regular rate and rhythm. No murmurs, gallops or rubs.  Abdomen:Soft. Bowel sounds present. Non-tender.  Extremities: No lower extremity edema. Pulses are 2 + in the bilateral DP/PT.  Labs:   Lab Results  Component Value Date   WBC 7.5 12/11/2014   HGB 15.4 12/11/2014   HCT 44.6 12/11/2014   MCV 89.7 12/11/2014   PLT 240 12/11/2014     Recent Labs Lab 12/10/14 0958 12/11/14 0500  NA 141 141  K 3.4* 4.3  CL 108 108  CO2 27 24  BUN 9 10  CREATININE 0.82 0.79  CALCIUM 8.9 9.4  PROT 6.8  --   BILITOT 0.5  --   ALKPHOS 77  --   ALT 19  --   AST 20  --   GLUCOSE 103* 95   Lab Results  Component Value Date   TROPONINI <0.03 12/11/2014    Echo 12/11/14: Left ventricle: The cavity size was normal. Wall thickness was normal. Systolic function was vigorous. The estimated ejection fraction was in the range of 65% to 70%. Wall motion was normal; there were no regional wall motion abnormalities. - Left atrium: The atrium was mildly dilated. - Atrial septum: No defect or patent foramen ovale was identified. - Pulmonary arteries: PA peak pressure: 37 mm Hg (S).   Chest x-ray:  Lungs are clear. Heart size and mediastinal contours are within normal limits. No effusion. No pneumothorax. Visualized skeletal structures are unremarkable.  IMPRESSION: No acute cardiopulmonary disease.  EKG: sinus, inferior TWI  ASSESSMENT AND PLAN:   1. Unstable angina: He has a good story for angina. EKG shows T wave inversions. Risk factors for CAD include HTN, tobacco abuse and strong family history of CAD. Will plan cardiac cath 12/12/14. Risks and benefits reviewed. He agrees to proceed.   2. HTN: BP  control per primary team.    Signed: Lauree Chandler, MD 12/11/2014, 3:29 PM

## 2014-12-12 NOTE — CV Procedure (Signed)
      Cardiac Catheterization Operative Report  Bradley [name] [id number] 2/5/20161:38 PM No PCP Per Patient  Procedure Performed:  1. Left Heart Catheterization 2. Selective Coronary Angiography 3. Left ventricular angiogram  Operator: Lauree Chandler, MD  Arterial access site:  Right radial artery.   Indication:  54 yo male with history of tobacco abuse, HTN and strong family history of CAD admitted with hypertensive crisis and chest pain. Cardiac cath to exclude CAD.                                      Procedure Details: The risks, benefits, complications, treatment options, and expected outcomes were discussed with the patient. The patient and/or family concurred with the proposed plan, giving informed consent. The patient was brought to the cath lab after IV hydration was begun and oral premedication was given. The patient was further sedated with Versed and Fentanyl. The right wrist was assessed with a modified Allens test which was positive. The right wrist was prepped and draped in a sterile fashion. 1% lidocaine was used for local anesthesia. Using the modified Seldinger access technique, a 5 French sheath was placed in the right radial artery. 3 mg Verapamil was given through the sheath. 4000 units IV heparin was given. Standard diagnostic catheters were used to perform selective coronary angiography. A pigtail catheter was used to perform a left ventricular angiogram. The sheath was removed from the right radial artery and a Terumo hemostasis band was applied at the arteriotomy site on the right wrist.   There were no immediate complications. The patient was taken to the recovery area in stable condition.   Hemodynamic Findings: Central aortic pressure: 126/79 Left ventricular pressure: 124/0/6  Angiographic Findings:  Left main:  No obstructive disease.   Left Anterior Descending Artery: Large caliber vessel that courses to the apex. The mid vessel has luminal  irregularities. The diagonal branch is moderate in caliber with ostial 40% stenosis.   Circumflex Artery: Large dominant vessel with large obtuse marginal branch. The AV groove Circumflex and left posterolateral branch have no obstructive disease. The obtuse marginal branch has a 30% stenosis.   Right Coronary Artery: Small to moderate caliber non-dominant vessel with 20% mid stenosis and 30% distal stenosis.   Left Ventricular Angiogram: LVEF=65%  Impression: 1. Mild non-obstructive CAD 2. Normal LV systolic function 3. Non-cardiac chest pain  Recommendations: Medical management of mild CAD. Would start low dose statin. Tobacco cessation and aggressive BP control.        Complications:  None. The patient tolerated the procedure well.

## 2014-12-12 NOTE — Progress Notes (Signed)
Patientt seen by PT for vestibular evaluation. PT recommended to hold meclizine for dizziness ( not to mask symptoms ) and will be reevaluated in the am by another PT specialist. Dr Tat notified of latter plan and recommendation by PT.

## 2014-12-12 NOTE — Progress Notes (Signed)
Cath planned later today. Pt without complaints this am.   Bradley [name] 12/12/2014 7:22 AM

## 2014-12-19 NOTE — Progress Notes (Signed)
   12/12/14 1649  PT G-Codes **NOT FOR INPATIENT CLASS**  Functional Assessment Tool Used assist level  Functional Limitation Mobility: Walking and moving around  Mobility: Walking and Moving Around Current Status (X5284) CI  Mobility: Walking and Moving Around Goal Status (X3244) CI  Mobility: Walking and Moving Around Discharge Status [phone]) CI  2014-12-24 late entry g-codes. Barbarann Ehlers [place], [place], DPT [phone]

## 2014-12-23 DIAGNOSIS — R079 Chest pain, unspecified: Secondary | ICD-10-CM | POA: Insufficient documentation

## 2014-12-23 NOTE — Discharge Summary (Signed)
Physician Discharge Summary  Bradley [name] WUJ:[id number] DOB: 07/25/1961 DOA: 12/10/2014  PCP: No PCP Per Patient  Admit date: 12/10/2014 Discharge date: 12/12/14  Recommendations for Outpatient Follow-up:  1. Pt will need to follow up with PCP in 2 weeks post discharge 2. Please obtain BMP in one week  Discharge Diagnoses:  Principal Problem:   Hypertensive urgency Active Problems:   Dizziness   Chest pain Chest Pain--noncardiac -may be related to his uncontrolled HTN -in the setting of his risk factors and exertional nature of his symptoms and abnormal EKG, consult cardiology for opinion on stress test -Troponins negative 3 and EKG with T-wave inversion in lead III, aVF -continue ASA -cardiology was consulted -12/12/14--cardiac cath--mild non-obstructive CAD, recommended aggressive BP control Dizziness -seems to be positional - orthostatic vital signs negative -MRI brain negative for acute findings -Echocardiogram EF 65-70%, no wall motion abnormalities -IVF help as well as treating BP -outpt PT Hypertensive urgency -home with metoprolol, hydralazine, amlodipine -UDS--positive THC -pt was noncompliant with meds at home Discharge Condition:stable  Disposition: home  Diet:heart healthy Wt Readings from Last 3 Encounters:  01/18/14 81.194 kg (179 lb)  01/01/14 77.111 kg (170 lb)    History of present illness:  54 year old male with a history of hypertension and chronic back pain presented with 3-4 day history of dizziness and exertional chest discomfort. The patient states that he was previously on antihypertensive medications, but has not taken his medications in approximately 3 months. Over the past several weeks, he has had some intermittent chest discomfort with shortness of breath and dizziness. Because of worsening of his symptoms, the patient presented to emergency Department. Orthostatic vital signs negative. The patient was noted to have a blood pressure  197/112.  Consultants: cardiology  Discharge Exam: Filed Vitals:   12/12/14 1700  BP: 125/93  Pulse: 88  Temp:   Resp:    Filed Vitals:   12/12/14 1410 12/12/14 1500 12/12/14 1525 12/12/14 1700  BP: 136/93 124/73 124/73 125/93  Pulse: 97 82 81 88  Temp:   98.1 F (36.7 C)   TempSrc:   Oral   Resp:   20   Height:      Weight:      SpO2: 98% 98% 99% 100%   General: A&O x 3, NAD, pleasant, cooperative Cardiovascular: RRR, no rub, no gallop, no S3 Respiratory: CTAB, no wheeze, no rhonchi Abdomen:soft, nontender, nondistended, positive bowel sounds Extremities: No edema, No lymphangitis, no petechiae  Discharge Instructions  Discharge Instructions    Diet - low sodium heart healthy    Complete by:  As directed      Increase activity slowly    Complete by:  As directed             Medication List    STOP taking these medications        BC HEADACHE PO     ibuprofen 800 MG tablet  Commonly known as:  ADVIL,MOTRIN     meloxicam 7.5 MG tablet  Commonly known as:  MOBIC     methocarbamol 500 MG tablet  Commonly known as:  ROBAXIN      TAKE these medications        amLODipine 10 MG tablet  Commonly known as:  NORVASC  Take 1 tablet (10 mg total) by mouth daily.     aspirin 81 MG chewable tablet  Chew 1 tablet (81 mg total) by mouth daily.     cyclobenzaprine 10 MG tablet  Commonly known as:  FLEXERIL  Take 1 tablet (10 mg total) by mouth 2 (two) times daily as needed for muscle spasms.     hydrALAZINE 50 MG tablet  Commonly known as:  APRESOLINE  Take 1 tablet (50 mg total) by mouth every 8 (eight) hours.     meclizine 25 MG tablet  Commonly known as:  ANTIVERT  Take 1 tablet (25 mg total) by mouth 3 (three) times daily as needed for dizziness.     metoprolol tartrate 25 MG tablet  Commonly known as:  LOPRESSOR  Take 1 tablet (25 mg total) by mouth 2 (two) times daily.     PRESCRIPTION MEDICATION  Take 1 tablet by mouth daily. unknown blood  pressure medication, not on file at any pharmacy         The results of significant diagnostics from this hospitalization (including imaging, microbiology, ancillary and laboratory) are listed below for reference.    Significant Diagnostic Studies: Dg Chest 2 View  12/10/2014   CLINICAL DATA:  chest pain, central chest sharp in nature that spreads to left and right arm. Complaining of numbness and tingling in right hand and fingers. Also reports dizziness and shortness of breath.  EXAM: CHEST - 2 VIEW  COMPARISON:  01/01/2014  FINDINGS: Lungs are clear. Heart size and mediastinal contours are within normal limits. No effusion.  No pneumothorax. Visualized skeletal structures are unremarkable.  IMPRESSION: No acute cardiopulmonary disease.   Electronically Signed   By: Arne Cleveland M.D.   On: 12/10/2014 11:50   Ct Head Wo Contrast  12/10/2014   CLINICAL DATA:  54 year old male with dizziness and syncope since yesterday. Initial encounter.  EXAM: CT HEAD WITHOUT CONTRAST  TECHNIQUE: Contiguous axial images were obtained from the base of the skull through the vertex without intravenous contrast.  COMPARISON:  Cervical spine radiographs 03/09/2012.  FINDINGS: Scattered paranasal sinus mucosal thickening and small mucous retention cysts. Mastoids and tympanic cavities are clear.  The frontal sinuses are hyperplastic, and there is a sclerotic lobulated right frontal sinus osteoma (series 3, image 31). This encompasses up to 25 x 15 mm axially.  No acute osseous abnormality identified. Negative orbit and scalp soft tissues.  Cerebral volume is within normal limits for age. No midline shift, ventriculomegaly, mass effect, evidence of mass lesion, intracranial hemorrhage or evidence of cortically based acute infarction. Gray-white matter differentiation is within normal limits throughout the brain. No suspicious intracranial vascular hyperdensity.  IMPRESSION: 1.  Normal for age non contrast CT appearance of  the brain. 2. 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   Electronically Signed   By: Lars Pinks M.D.   On: 12/10/2014 12:01   Mr Brain Wo Contrast  12/10/2014   CLINICAL DATA:  Dizziness.  Near syncope.  EXAM: MRI HEAD WITHOUT CONTRAST  TECHNIQUE: Multiplanar, multiecho pulse sequences of the brain and surrounding structures were obtained without intravenous contrast.  COMPARISON:  CT head 12/10/2014  FINDINGS: Ventricle size is normal. Cerebral volume is normal. Pituitary normal in size. Craniocervical junction normal.  Negative for acute infarct.  Several small hyperintensities in the deep white matter bilaterally likely due to chronic microvascular ischemia. Brainstem and cerebellum normal.  Negative for intracranial hemorrhage.  Negative for mass or edema.  Mild mucosal edema right maxillary sinus. Small mastoid sinus effusion on the right  IMPRESSION: Normal for age noncontrast CT of the head.   Electronically Signed   By: Franchot Gallo M.D.   On: 12/10/2014 15:05  Microbiology: No results found for this or any previous visit (from the past 240 hour(s)).   Labs: Basic Metabolic Panel: No results for input(s): NA, K, CL, CO2, GLUCOSE, BUN, CREATININE, CALCIUM, MG, PHOS in the last 168 hours. Liver Function Tests: No results for input(s): AST, ALT, ALKPHOS, BILITOT, PROT, ALBUMIN in the last 168 hours. No results for input(s): LIPASE, AMYLASE in the last 168 hours. No results for input(s): AMMONIA in the last 168 hours. CBC: No results for input(s): WBC, NEUTROABS, HGB, HCT, MCV, PLT in the last 168 hours. Cardiac Enzymes: No results for input(s): CKTOTAL, CKMB, CKMBINDEX, TROPONINI in the last 168 hours. BNP: Invalid input(s): POCBNP CBG: No results for input(s): GLUCAP in the last 168 hours.  Time coordinating discharge:  Greater than 30 minutes  Signed:  Deedra Pro, DO [facility] Pager: [phone] 12/23/2014, 6:33 PM

## 2015-01-28 ENCOUNTER — Emergency Department (HOSPITAL_COMMUNITY)
Admission: EM | Admit: 2015-01-28 | Discharge: 2015-01-28 | Disposition: A | Discharge status: Home / Self Care | Payer: 59 | Attending: Emergency Medicine | Admitting: Emergency Medicine

## 2015-01-28 ENCOUNTER — Encounter (HOSPITAL_COMMUNITY): Payer: Self-pay | Admitting: Emergency Medicine

## 2015-01-28 DIAGNOSIS — M62830 Muscle spasm of back: Secondary | ICD-10-CM | POA: Insufficient documentation

## 2015-01-28 DIAGNOSIS — M545 Low back pain, unspecified: Secondary | ICD-10-CM

## 2015-01-28 DIAGNOSIS — Z9889 Other specified postprocedural states: Secondary | ICD-10-CM | POA: Insufficient documentation

## 2015-01-28 DIAGNOSIS — I10 Essential (primary) hypertension: Secondary | ICD-10-CM | POA: Insufficient documentation

## 2015-01-28 DIAGNOSIS — M1990 Unspecified osteoarthritis, unspecified site: Secondary | ICD-10-CM | POA: Diagnosis not present

## 2015-01-28 DIAGNOSIS — Z79899 Other long term (current) drug therapy: Secondary | ICD-10-CM | POA: Insufficient documentation

## 2015-01-28 DIAGNOSIS — Z87448 Personal history of other diseases of urinary system: Secondary | ICD-10-CM | POA: Diagnosis not present

## 2015-01-28 DIAGNOSIS — Z720 Tobacco use: Secondary | ICD-10-CM | POA: Diagnosis not present

## 2015-01-28 DIAGNOSIS — H02849 Edema of unspecified eye, unspecified eyelid: Secondary | ICD-10-CM | POA: Insufficient documentation

## 2015-01-28 DIAGNOSIS — R21 Rash and other nonspecific skin eruption: Secondary | ICD-10-CM | POA: Insufficient documentation

## 2015-01-28 DIAGNOSIS — H02846 Edema of left eye, unspecified eyelid: Secondary | ICD-10-CM | POA: Diagnosis not present

## 2015-01-28 DIAGNOSIS — Z7982 Long term (current) use of aspirin: Secondary | ICD-10-CM | POA: Insufficient documentation

## 2015-01-28 DIAGNOSIS — M79604 Pain in right leg: Secondary | ICD-10-CM | POA: Diagnosis present

## 2015-01-28 DIAGNOSIS — G8929 Other chronic pain: Secondary | ICD-10-CM | POA: Diagnosis not present

## 2015-01-28 MED ORDER — CYCLOBENZAPRINE HCL 5 MG PO TABS: 5.0000 mg | ORAL_TABLET | Freq: Three times a day (TID) | ORAL | Status: DC | PRN

## 2015-01-28 MED ORDER — LIDOCAINE 5 % EX PTCH: 1.0000 | MEDICATED_PATCH | CUTANEOUS | Status: DC

## 2015-01-28 MED ORDER — KETOTIFEN FUMARATE 0.025 % OP SOLN: 1.0000 [drp] | Freq: Two times a day (BID) | OPHTHALMIC | Status: DC

## 2015-01-28 MED ORDER — NAPROXEN 500 MG PO TABS
500.0000 mg | ORAL_TABLET | Freq: Two times a day (BID) | ORAL | Status: DC
Start: 2015-01-28 — End: 2016-04-20

## 2015-01-28 NOTE — ED Notes (Signed)
Pt ambulatory in room with steady gait 

## 2015-01-28 NOTE — ED Provider Notes (Signed)
CSN: [id number]     Arrival date & time 01/28/15  1148 History  This chart was scribed for non-physician practitioner, Margarita Mail, PA-C, working with Daleen Bo, MD by Ladene Artist, ED Scribe. This patient was seen in room TR10C/TR10C and the patient's care was started at 12:46 PM.   Chief Complaint  Patient presents with  . Leg Pain   The history is provided by the patient. No language interpreter was used.   HPI Comments: Bradley [name] is a 54 y.o. male, with a h/o HTN, chronic back pain, who presents to the Emergency Department complaining of constant R leg pain for the past week. He reports associated lower back pain and R hip pain that is exacerbated with movement. He denies urinary or bowel incontinence, numbness in groin. Pt also reports itchy rash to R thigh and bilateral arms as well as eye swelling, itching and redness. Pt denies visual disturbances.   Past Medical History  Diagnosis Date  . Seasonal allergies   . Hypertension   . Chronic back pain   . Sciatica   . Kidney stones   . Chronic sinusitis   . Arthritis     "lower back by by butt; right shoulder" (12/10/2014)  . Tobacco abuse    Past Surgical History  Procedure Laterality Date  . Kidney stone surgery  ~ 1980    "laser"  . Incision and drainage abscess      "boils; under right arm, hip, side"  . Left heart catheterization with coronary angiogram N/A 12/12/2014    Procedure: LEFT HEART CATHETERIZATION WITH CORONARY ANGIOGRAM;  Surgeon: Burnell Blanks, MD;  Location: [facility] CATH LAB;  Service: Cardiovascular;  Laterality: N/A;   Family History  Problem Relation Age of Onset  . Coronary artery disease Father   . Coronary artery disease Mother   . Coronary artery disease Sister    History  Substance Use Topics  . Smoking status: Current Every Day Smoker -- 1.00 packs/day for 44 years    Types: Cigarettes  . Smokeless tobacco: Never Used  . Alcohol Use: 2.4 oz/week    4 Cans of beer per week     Review of Systems  Eyes: Positive for discharge, redness and itching. Negative for visual disturbance.  Musculoskeletal: Positive for back pain and arthralgias.  Skin: Positive for rash.  Neurological: Negative for numbness.   Allergies  Review of patient's allergies indicates no known allergies.  Home Medications   Prior to Admission medications   Medication Sig Start Date End Date Taking? Authorizing Provider  amLODipine (NORVASC) 10 MG tablet Take 1 tablet (10 mg total) by mouth daily. 12/12/14   Orson Eva, MD  aspirin 81 MG chewable tablet Chew 1 tablet (81 mg total) by mouth daily. 12/13/14   Orson Eva, MD  cyclobenzaprine (FLEXERIL) 10 MG tablet Take 1 tablet (10 mg total) by mouth 2 (two) times daily as needed for muscle spasms. Patient not taking: Reported on 12/10/2014 02/25/14   Alvina Chou, PA-C  hydrALAZINE (APRESOLINE) 50 MG tablet Take 1 tablet (50 mg total) by mouth every 8 (eight) hours. 12/12/14   Orson Eva, MD  meclizine (ANTIVERT) 25 MG tablet Take 1 tablet (25 mg total) by mouth 3 (three) times daily as needed for dizziness. 12/12/14   Orson Eva, MD  metoprolol tartrate (LOPRESSOR) 25 MG tablet Take 1 tablet (25 mg total) by mouth 2 (two) times daily. 12/12/14   Orson Eva, MD  PRESCRIPTION MEDICATION Take 1 tablet by mouth daily.  unknown blood pressure medication, not on file at any pharmacy    Historical Provider, MD   BP 133/95 mmHg  Pulse 63  Temp(Src) 98.8 F (37.1 C) (Oral)  Resp 16  Ht 5\' 6"  (1.676 m)  Wt 178 lb (80.74 kg)  BMI 28.74 kg/m2  SpO2 99% Physical Exam  Constitutional: He is oriented to person, place, and time. He appears well-developed and well-nourished. No distress.  HENT:  Head: Normocephalic and atraumatic.  Purulent EOM. Bilateral lid swelling and erythema. No signs of infection. Appears allergic.  Eyes: Conjunctivae and EOM are normal.  Neck: Neck supple. No tracheal deviation present.  Cardiovascular: Normal rate.   Pulmonary/Chest:  Effort normal. No respiratory distress.  Musculoskeletal: Normal range of motion.  Full strength and hip flexion and extension. Spasm in the L and R lumbar paraspinals. No weakness.   Neurological: He is alert and oriented to person, place, and time. He has normal reflexes.  Skin: Skin is warm and dry.  Macular, papular, erythematous area on R thigh. Signs of hyperpigmentation. No signs of infections.   Psychiatric: He has a normal mood and affect. His behavior is normal.  Nursing note and vitals reviewed.  ED Course  Procedures (including critical care time) DIAGNOSTIC STUDIES: Oxygen Saturation is 99% on RA, normal by my interpretation.    COORDINATION OF CARE: 12:50 PM-Discussed treatment plan which includes Naproxen, Zaditor, Flexeril and Lidoderm with pt at bedside and pt agreed to plan.   Labs Review Labs Reviewed - No data to display  Imaging Review No results found.   EKG Interpretation None      MDM   Final diagnoses:  Rash  Bilateral low back pain without sciatica   Patient with small rash. It appears to be allergic. Treat with topical steroids. Eye allergies. OTC allergy meds.Patient with back pain.  No neurological deficits and normal neuro exam.  Patient can walk but states is painful.  No loss of bowel or bladder control.  No concern for cauda equina.  No fever, night sweats, weight loss, h/o cancer, IVDU.  RICE protocol and pain medicine indicated and discussed with patient.    The patient's paper medical record is not available during this visit. It has been removed from this office and cannot be located.   Margarita Mail, PA-C 02/02/15 [place], MD 02/05/15 [phone]

## 2015-01-28 NOTE — Discharge Instructions (Signed)
Allergies °Allergies may happen from anything your body is sensitive to. This may be food, medicines, pollens, chemicals, and nearly anything around you in everyday life that produces allergens. An allergen is anything that causes an allergy producing substance. Heredity is often a factor in causing these problems. This means you may have some of the same allergies as your parents. °Food allergies happen in all age groups. Food allergies are some of the most severe and life threatening. Some common food allergies are cow's milk, seafood, eggs, nuts, wheat, and soybeans. °SYMPTOMS  °· Swelling around the mouth. °· An itchy red rash or hives. °· Vomiting or diarrhea. °· Difficulty breathing. °SEVERE ALLERGIC REACTIONS ARE LIFE-THREATENING. °This reaction is called anaphylaxis. It can cause the mouth and throat to swell and cause difficulty with breathing and swallowing. In severe reactions only a trace amount of food (for example, peanut oil in a salad) may cause death within seconds. °Seasonal allergies occur in all age groups. These are seasonal because they usually occur during the same season every year. They may be a reaction to molds, grass pollens, or tree pollens. Other causes of problems are house dust mite allergens, pet dander, and mold spores. The symptoms often consist of nasal congestion, a runny itchy nose associated with sneezing, and tearing itchy eyes. There is often an associated itching of the mouth and ears. The problems happen when you come in contact with pollens and other allergens. Allergens are the particles in the air that the body reacts to with an allergic reaction. This causes you to release allergic antibodies. Through a chain of events, these eventually cause you to release histamine into the blood stream. Although it is meant to be protective to the body, it is this release that causes your discomfort. This is why you were given anti-histamines to feel better.  If you are unable to  pinpoint the offending allergen, it may be determined by skin or blood testing. Allergies cannot be cured but can be controlled with medicine. °Hay fever is a collection of all or some of the seasonal allergy problems. It may often be treated with simple over-the-counter medicine such as diphenhydramine. Take medicine as directed. Do not drink alcohol or drive while taking this medicine. Check with your caregiver or package insert for child dosages. °If these medicines are not effective, there are many new medicines your caregiver can prescribe. Stronger medicine such as nasal spray, eye drops, and corticosteroids may be used if the first things you try do not work well. Other treatments such as immunotherapy or desensitizing injections can be used if all else fails. Follow up with your caregiver if problems continue. These seasonal allergies are usually not life threatening. They are generally more of a nuisance that can often be handled using medicine. °HOME CARE INSTRUCTIONS  °· If unsure what causes a reaction, keep a diary of foods eaten and symptoms that follow. Avoid foods that cause reactions. °· If hives or rash are present: °¨ Take medicine as directed. °¨ You may use an over-the-counter antihistamine (diphenhydramine) for hives and itching as needed. °¨ Apply cold compresses (cloths) to the skin or take baths in cool water. Avoid hot baths or showers. Heat will make a rash and itching worse. °· If you are severely allergic: °¨ Following a treatment for a severe reaction, hospitalization is often required for closer follow-up. °¨ Wear a medic-alert bracelet or necklace stating the allergy. °¨ You and your family must learn how to give adrenaline or use   an anaphylaxis kit.  If you have had a severe reaction, always carry your anaphylaxis kit or EpiPen with you. Use this medicine as directed by your caregiver if a severe reaction is occurring. Failure to do so could have a fatal outcome. SEEK MEDICAL  CARE IF:  You suspect a food allergy. Symptoms generally happen within 30 minutes of eating a food.  Your symptoms have not gone away within 2 days or are getting worse.  You develop new symptoms.  You want to retest yourself or your child with a food or drink you think causes an allergic reaction. Never do this if an anaphylactic reaction to that food or drink has happened before. Only do this under the care of a caregiver. SEEK IMMEDIATE MEDICAL CARE IF:   You have difficulty breathing, are wheezing, or have a tight feeling in your chest or throat.  You have a swollen mouth, or you have hives, swelling, or itching all over your body.  You have had a severe reaction that has responded to your anaphylaxis kit or an EpiPen. These reactions may return when the medicine has worn off. These reactions should be considered life threatening. MAKE SURE YOU:   Understand these instructions.  Will watch your condition.  Will get help right away if you are not doing well or get worse. Document Released: 01/17/2003 Document Revised: 02/18/2013 Document Reviewed: 06/23/2008 [facility] Patient Information 2015 [place], [place]. This information is not intended to replace advice given to you by your health care provider. Make sure you discuss any questions you have with your health care provider.  Back Pain, Adult Low back pain is very common. About 1 in 5 people have back pain.The cause of low back pain is rarely dangerous. The pain often gets better over time.About half of people with a sudden onset of back pain feel better in just 2 weeks. About 8 in 10 people feel better by 6 weeks.  CAUSES Some common causes of back pain include:  Strain of the muscles or ligaments supporting the spine.  Wear and tear (degeneration) of the spinal discs.  Arthritis.  Direct injury to the back. DIAGNOSIS Most of the time, the direct cause of low back pain is not known.However, back pain can be treated  effectively even when the exact cause of the pain is unknown.Answering your caregiver's questions about your overall health and symptoms is one of the most accurate ways to make sure the cause of your pain is not dangerous. If your caregiver needs more information, he or she may order lab work or imaging tests (X-rays or MRIs).However, even if imaging tests show changes in your back, this usually does not require surgery. HOME CARE INSTRUCTIONS For many people, back pain returns.Since low back pain is rarely dangerous, it is often a condition that people can learn to [facility] their own.   Remain active. It is stressful on the back to sit or stand in one place. Do not sit, drive, or stand in one place for more than 30 minutes at a time. Take short walks on level surfaces as soon as pain allows.Try to increase the length of time you walk each day.  Do not stay in bed.Resting more than 1 or 2 days can delay your recovery.  Do not avoid exercise or work.Your body is made to move.It is not dangerous to be active, even though your back may hurt.Your back will likely heal faster if you return to being active before your pain is gone.  Pay attention to your body when you bend and lift. Many people have less discomfortwhen lifting if they bend their knees, keep the load close to their bodies,and avoid twisting. Often, the most comfortable positions are those that put less stress on your recovering back.  Find a comfortable position to sleep. Use a firm mattress and lie on your side with your knees slightly bent. If you lie on your back, put a pillow under your knees.  Only take over-the-counter or prescription medicines as directed by your caregiver. Over-the-counter medicines to reduce pain and inflammation are often the most helpful.Your caregiver may prescribe muscle relaxant drugs.These medicines help dull your pain so you can more quickly return to your normal activities and healthy  exercise.  Put ice on the injured area.  Put ice in a plastic bag.  Place a towel between your skin and the bag.  Leave the ice on for 15-20 minutes, 03-04 times a day for the first 2 to 3 days. After that, ice and heat may be alternated to reduce pain and spasms.  Ask your caregiver about trying back exercises and gentle massage. This may be of some benefit.  Avoid feeling anxious or stressed.Stress increases muscle tension and can worsen back pain.It is important to recognize when you are anxious or stressed and learn ways to manage it.Exercise is a great option. SEEK MEDICAL CARE IF:  You have pain that is not relieved with rest or medicine.  You have pain that does not improve in 1 week.  You have new symptoms.  You are generally not feeling well. SEEK IMMEDIATE MEDICAL CARE IF:   You have pain that radiates from your back into your legs.  You develop new bowel or bladder control problems.  You have unusual weakness or numbness in your arms or legs.  You develop nausea or vomiting.  You develop abdominal pain.  You feel faint. Document Released: 10/24/2005 Document Revised: 04/24/2012 Document Reviewed: 02/25/2014 [facility] Patient Information 2015 [place], [place]. This information is not intended to replace advice given to you by your health care provider. Make sure you discuss any questions you have with your health care provider.

## 2015-01-28 NOTE — ED Notes (Addendum)
Pt reports rash to R leg since last week then leg became painful. Pt having pain in hip and R lower back too. Hx sciatica. Generalized red rash to R thigh noted- sts it itches. Pt also wants his eyes checked- reports swelling to both eyes and they itch as well.

## 2015-06-26 ENCOUNTER — Encounter (HOSPITAL_COMMUNITY): Payer: Self-pay | Admitting: Nurse Practitioner

## 2015-06-26 ENCOUNTER — Emergency Department (HOSPITAL_COMMUNITY)
Admission: EM | Admit: 2015-06-26 | Discharge: 2015-06-26 | Disposition: A | Discharge status: Home / Self Care | Payer: 59 | Attending: Emergency Medicine | Admitting: Emergency Medicine

## 2015-06-26 DIAGNOSIS — Z720 Tobacco use: Secondary | ICD-10-CM | POA: Insufficient documentation

## 2015-06-26 DIAGNOSIS — Z791 Long term (current) use of non-steroidal anti-inflammatories (NSAID): Secondary | ICD-10-CM | POA: Insufficient documentation

## 2015-06-26 DIAGNOSIS — M25511 Pain in right shoulder: Secondary | ICD-10-CM | POA: Diagnosis not present

## 2015-06-26 DIAGNOSIS — M47816 Spondylosis without myelopathy or radiculopathy, lumbar region: Secondary | ICD-10-CM | POA: Insufficient documentation

## 2015-06-26 DIAGNOSIS — M19011 Primary osteoarthritis, right shoulder: Secondary | ICD-10-CM | POA: Diagnosis not present

## 2015-06-26 DIAGNOSIS — Z79899 Other long term (current) drug therapy: Secondary | ICD-10-CM | POA: Diagnosis not present

## 2015-06-26 DIAGNOSIS — G8929 Other chronic pain: Secondary | ICD-10-CM | POA: Diagnosis not present

## 2015-06-26 DIAGNOSIS — Z87442 Personal history of urinary calculi: Secondary | ICD-10-CM | POA: Insufficient documentation

## 2015-06-26 DIAGNOSIS — Z7982 Long term (current) use of aspirin: Secondary | ICD-10-CM | POA: Diagnosis not present

## 2015-06-26 DIAGNOSIS — Z8709 Personal history of other diseases of the respiratory system: Secondary | ICD-10-CM | POA: Diagnosis not present

## 2015-06-26 DIAGNOSIS — I10 Essential (primary) hypertension: Secondary | ICD-10-CM | POA: Insufficient documentation

## 2015-06-26 DIAGNOSIS — R200 Anesthesia of skin: Secondary | ICD-10-CM | POA: Diagnosis not present

## 2015-06-26 DIAGNOSIS — M7918 Myalgia, other site: Secondary | ICD-10-CM

## 2015-06-26 DIAGNOSIS — M545 Low back pain: Secondary | ICD-10-CM | POA: Diagnosis not present

## 2015-06-26 DIAGNOSIS — Z9889 Other specified postprocedural states: Secondary | ICD-10-CM | POA: Diagnosis not present

## 2015-06-26 MED ORDER — METHOCARBAMOL 500 MG PO TABS
500.0000 mg | ORAL_TABLET | Freq: Once | ORAL | Status: AC
  Administered 2015-06-26: 500 mg via ORAL
  Filled 2015-06-26: qty 1

## 2015-06-26 MED ORDER — METHOCARBAMOL 500 MG PO TABS: 1000.0000 mg | ORAL_TABLET | Freq: Four times a day (QID) | ORAL | Status: DC | PRN

## 2015-06-26 MED ORDER — IBUPROFEN 400 MG PO TABS
400.0000 mg | ORAL_TABLET | Freq: Once | ORAL | Status: AC
  Administered 2015-06-26: 400 mg via ORAL
  Filled 2015-06-26: qty 1

## 2015-06-26 NOTE — Discharge Instructions (Signed)
For pain control you may take up to 800mg  of Motrin (also known as ibuprofen). That is usually 4 over the counter pills,  3 times a day. Take with food to minimize stomach irritation   For breakthrough pain you may take Robaxin. Do not drink alcohol, drive or operate heavy machinery when taking Robaxin.  Please follow with your primary care doctor in the next 2 days for a check-up. They must obtain records for further management.   Do not hesitate to return to the Emergency Department for any new, worsening or concerning symptoms.    Musculoskeletal Pain Musculoskeletal pain is muscle and boney aches and pains. These pains can occur in any part of the body. Your caregiver may treat you without knowing the cause of the pain. They may treat you if blood or urine tests, X-rays, and other tests were normal.  CAUSES There is often not a definite cause or reason for these pains. These pains may be caused by a type of germ (virus). The discomfort may also come from overuse. Overuse includes working out too hard when your body is not fit. Boney aches also come from weather changes. Bone is sensitive to atmospheric pressure changes. HOME CARE INSTRUCTIONS   Ask when your test results will be ready. Make sure you get your test results.  Only take over-the-counter or prescription medicines for pain, discomfort, or fever as directed by your caregiver. If you were given medications for your condition, do not drive, operate machinery or power tools, or sign legal documents for 24 hours. Do not drink alcohol. Do not take sleeping pills or other medications that may interfere with treatment.  Continue all activities unless the activities cause more pain. When the pain lessens, slowly resume normal activities. Gradually increase the intensity and duration of the activities or exercise.  During periods of severe pain, bed rest may be helpful. Lay or sit in any position that is comfortable.  Putting ice on the  injured area.  Put ice in a bag.  Place a towel between your skin and the bag.  Leave the ice on for 15 to 20 minutes, 3 to 4 times a day.  Follow up with your caregiver for continued problems and no reason can be found for the pain. If the pain becomes worse or does not go away, it may be necessary to repeat tests or do additional testing. Your caregiver may need to look further for a possible cause. SEEK IMMEDIATE MEDICAL CARE IF:  You have pain that is getting worse and is not relieved by medications.  You develop chest pain that is associated with shortness or breath, sweating, feeling sick to your stomach (nauseous), or throw up (vomit).  Your pain becomes localized to the abdomen.  You develop any new symptoms that seem different or that concern you. MAKE SURE YOU:   Understand these instructions.  Will watch your condition.  Will get help right away if you are not doing well or get worse. Document Released: 10/24/2005 Document Revised: 01/16/2012 Document Reviewed: 06/28/2013 [facility] Patient Information 2015 [place], [place]. This information is not intended to replace advice given to you by your health care provider. Make sure you discuss any questions you have with your health care provider.

## 2015-06-26 NOTE — ED Notes (Signed)
Pt c/o lower back pain and right shoulder pain,.

## 2015-06-26 NOTE — ED Provider Notes (Signed)
CSN: [id number]     Arrival date & time 06/26/15  1623 History  This chart was scribed for Bradley Blitz, PA-C, working with Bradley Bucy, MD by Bradley [name], ED Scribe. This patient was seen in room TR07C/TR07C and the patient's care was started at 5:06 PM.   Chief Complaint  Patient presents with  . Back Pain   The history is provided by the patient. No language interpreter was used.    HPI Comments: Bradley [name] is a 54 y.o. male with hx of sciatica, scoliosis who presents to the Emergency Department complaining of constant, non-radiating bilateral lower back pain as well as right shoulder pain with radiating numbness down the right arm onset two days ago.   The pain is relieved by raising the right hand above the head and not relieved by 500 mg Tylenol.  These complaints onset after lifting heavy boxes.  He is able to ambulate normal to baseline.  He denies bowel/bladder incontinence.     Past Medical History  Diagnosis Date  . Seasonal allergies   . Hypertension   . Chronic back pain   . Sciatica   . Kidney stones   . Chronic sinusitis   . Arthritis     "lower back by by butt; right shoulder" (12/10/2014)  . Tobacco abuse    Past Surgical History  Procedure Laterality Date  . Kidney stone surgery  ~ 1980    "laser"  . Incision and drainage abscess      "boils; under right arm, hip, side"  . Left heart catheterization with coronary angiogram N/A 12/12/2014    Procedure: LEFT HEART CATHETERIZATION WITH CORONARY ANGIOGRAM;  Surgeon: Bradley Blanks, MD;  Location: [facility] CATH LAB;  Service: Cardiovascular;  Laterality: N/A;   Family History  Problem Relation Age of Onset  . Coronary artery disease Father   . Coronary artery disease Mother   . Coronary artery disease Sister    Social History  Substance Use Topics  . Smoking status: Current Every Day Smoker -- 1.00 packs/day for 44 years    Types: Cigarettes  . Smokeless tobacco: Never Used  . Alcohol Use: 2.4 oz/week     4 Cans of beer per week    Review of Systems  Musculoskeletal: Positive for arthralgias.      Allergies  Review of patient's allergies indicates no known allergies.  Home Medications   Prior to Admission medications   Medication Sig Start Date End Date Taking? Authorizing Provider  amLODipine (NORVASC) 10 MG tablet Take 1 tablet (10 mg total) by mouth daily. 12/12/14   Bradley Eva, MD  aspirin 81 MG chewable tablet Chew 1 tablet (81 mg total) by mouth daily. 12/13/14   Bradley Eva, MD  cyclobenzaprine (FLEXERIL) 5 MG tablet Take 1 tablet (5 mg total) by mouth 3 (three) times daily as needed for muscle spasms. 01/28/15   Bradley Mail, PA-C  hydrALAZINE (APRESOLINE) 50 MG tablet Take 1 tablet (50 mg total) by mouth every 8 (eight) hours. 12/12/14   Bradley Eva, MD  ketotifen (ZADITOR) 0.025 % ophthalmic solution Place 1 drop into both eyes 2 (two) times daily. 01/28/15   Bradley Mail, PA-C  lidocaine (LIDODERM) 5 % Place 1 patch onto the skin daily. Remove & Discard patch within 12 hours or as directed by MD 01/28/15   Bradley Mail, PA-C  meclizine (ANTIVERT) 25 MG tablet Take 1 tablet (25 mg total) by mouth 3 (three) times daily as needed for dizziness. 12/12/14   Bradley Brow  Tat, MD  metoprolol tartrate (LOPRESSOR) 25 MG tablet Take 1 tablet (25 mg total) by mouth 2 (two) times daily. 12/12/14   Bradley Eva, MD  naproxen (NAPROSYN) 500 MG tablet Take 1 tablet (500 mg total) by mouth 2 (two) times daily with a meal. 01/28/15   Bradley Mail, PA-C  PRESCRIPTION MEDICATION Take 1 tablet by mouth daily. unknown blood pressure medication, not on file at any pharmacy    Historical Provider, MD   BP 134/104 mmHg  Pulse 74  Temp(Src) 98 F (36.7 C) (Oral)  Resp 18  Ht 5\' 9"  (1.753 m)  Wt 179 lb (81.194 kg)  BMI 26.42 kg/m2  SpO2 99% Physical Exam  Constitutional: He is oriented to person, place, and time. He appears well-developed and well-nourished. No distress.  HENT:  Head: Normocephalic and  atraumatic.  Eyes: Conjunctivae and EOM are normal.  Neck: Normal range of motion. Neck supple. No tracheal deviation present.  Cardiovascular: Normal rate, regular rhythm and intact distal pulses.   Pulmonary/Chest: Effort normal. No respiratory distress.  Abdominal: Soft. There is no tenderness.  Musculoskeletal: Normal range of motion. He exhibits tenderness. He exhibits no edema.  Right shoulder:  Shoulder with no deformity. FROM to shoulder and elbow. No TTP of rotator cuff musculature. Drop arm negative. Neurovascularly intact   Neurological: He is alert and oriented to person, place, and time.  No point tenderness to percussion of lumbar spinal processes.  No TTP or paraspinal muscular spasm. Strength is 5 out of 5 to bilateral lower extremities at hip and knee; extensor hallucis longus 5 out of 5. Ankle strength 5 out of 5, no clonus, neurovascularly intact. No saddle anaesthesia. Patellar reflexes are 2+ bilaterally.     Skin: Skin is warm and dry.     Psychiatric: He has a normal mood and affect. His behavior is normal.  Nursing note and vitals reviewed.   ED Course  Procedures (including critical care time)  DIAGNOSTIC STUDIES: Oxygen Saturation is 99% on RA, normal by my interpretation.    COORDINATION OF CARE:  5:12 PM Discussed treatment plan with patient at bedside.  Patient acknowledges and agrees with plan.    Labs Review Labs Reviewed - No data to display  Imaging Review No results found. I have personally reviewed and evaluated these images and lab results as part of my medical decision-making.   EKG Interpretation None      MDM   Final diagnoses:  Musculoskeletal pain    Filed Vitals:   06/26/15 1701  BP: 134/104  Pulse: 74  Temp: 98 F (36.7 C)  TempSrc: Oral  Resp: 18  Height: 5\' 9"  (1.753 m)  Weight: 179 lb (81.194 kg)  SpO2: 99%    Medications  ibuprofen (ADVIL,MOTRIN) tablet 400 mg (400 mg Oral Given 06/26/15 1749)  methocarbamol  (ROBAXIN) tablet 500 mg (500 mg Oral Given 06/26/15 1750)    Bradley [name] is a pleasant 54 y.o. male presenting with right shoulder and low back pain after lifting.  No neurological deficits and normal neuro exam.  Patient can walk but states is painful.  No loss of bowel or bladder control.  No concern for cauda equina.  No fever, night sweats, weight loss, h/o cancer, IVDU.  RICE protocol and pain medicine indicated and discussed with patient.  Evaluation does not show pathology that would require ongoing emergent intervention or inpatient treatment. Pt is hemodynamically stable and mentating appropriately. Discussed findings and plan with patient/guardian, who agrees with care plan.  All questions answered. Return precautions discussed and outpatient follow up given.   Discharge Medication List as of 06/26/2015  5:15 PM    START taking these medications   Details  methocarbamol (ROBAXIN) 500 MG tablet Take 2 tablets (1,000 mg total) by mouth 4 (four) times daily as needed (Pain)., Starting 06/26/2015, Until Discontinued, Print            Bradley Blitz, PA-C 06/26/15 [place], MD 06/27/15 [phone]

## 2015-06-26 NOTE — ED Notes (Signed)
He c/o lower back and R shoulder pain onset 2 days ago after lifting heavy boxes. Reports hx back pain. Has tried nothing at home for the pain. Ambulatory, mae

## 2016-04-20 ENCOUNTER — Emergency Department (HOSPITAL_COMMUNITY): Payer: Medicaid Other

## 2016-04-20 ENCOUNTER — Emergency Department (HOSPITAL_COMMUNITY)
Admission: EM | Admit: 2016-04-20 | Discharge: 2016-04-20 | Disposition: A | Discharge status: Home / Self Care | Payer: Medicaid Other | Attending: Emergency Medicine | Admitting: Emergency Medicine

## 2016-04-20 ENCOUNTER — Encounter (HOSPITAL_COMMUNITY): Payer: Self-pay | Admitting: Family Medicine

## 2016-04-20 DIAGNOSIS — M5441 Lumbago with sciatica, right side: Secondary | ICD-10-CM | POA: Diagnosis not present

## 2016-04-20 DIAGNOSIS — Z79899 Other long term (current) drug therapy: Secondary | ICD-10-CM | POA: Insufficient documentation

## 2016-04-20 DIAGNOSIS — Z7982 Long term (current) use of aspirin: Secondary | ICD-10-CM | POA: Insufficient documentation

## 2016-04-20 DIAGNOSIS — R1030 Lower abdominal pain, unspecified: Secondary | ICD-10-CM | POA: Diagnosis not present

## 2016-04-20 DIAGNOSIS — R519 Headache, unspecified: Secondary | ICD-10-CM

## 2016-04-20 DIAGNOSIS — I10 Essential (primary) hypertension: Secondary | ICD-10-CM | POA: Insufficient documentation

## 2016-04-20 DIAGNOSIS — F17210 Nicotine dependence, cigarettes, uncomplicated: Secondary | ICD-10-CM | POA: Insufficient documentation

## 2016-04-20 DIAGNOSIS — R51 Headache: Secondary | ICD-10-CM | POA: Diagnosis not present

## 2016-04-20 LAB — URINALYSIS, ROUTINE W REFLEX MICROSCOPIC
Bilirubin Urine: NEGATIVE
Glucose, UA: NEGATIVE mg/dL
Ketones, ur: NEGATIVE mg/dL
Leukocytes, UA: NEGATIVE
Nitrite: NEGATIVE
Protein, ur: NEGATIVE mg/dL
Specific Gravity, Urine: 1.019 (ref 1.005–1.030)
pH: 5.5 (ref 5.0–8.0)

## 2016-04-20 LAB — CBC WITH DIFFERENTIAL/PLATELET
Basophils Absolute: 0 10*3/uL (ref 0.0–0.1)
Basophils Relative: 0 %
Eosinophils Absolute: 0.1 10*3/uL (ref 0.0–0.7)
Eosinophils Relative: 2 %
HCT: 39.2 % (ref 39.0–52.0)
Hemoglobin: 13.3 g/dL (ref 13.0–17.0)
Lymphocytes Relative: 22 %
Lymphs Abs: 1.8 10*3/uL (ref 0.7–4.0)
MCH: 30.4 pg (ref 26.0–34.0)
MCHC: 33.9 g/dL (ref 30.0–36.0)
MCV: 89.7 fL (ref 78.0–100.0)
Monocytes Absolute: 0.5 10*3/uL (ref 0.1–1.0)
Monocytes Relative: 6 %
Neutro Abs: 5.7 10*3/uL (ref 1.7–7.7)
Neutrophils Relative %: 70 %
Platelets: 202 10*3/uL (ref 150–400)
RBC: 4.37 MIL/uL (ref 4.22–5.81)
RDW: 13.8 % (ref 11.5–15.5)
WBC: 8.1 10*3/uL (ref 4.0–10.5)

## 2016-04-20 LAB — URINE MICROSCOPIC-ADD ON

## 2016-04-20 LAB — I-STAT CHEM 8, ED
BUN: 12 mg/dL (ref 6–20)
Calcium, Ion: 1.14 mmol/L (ref 1.12–1.23)
Chloride: 106 mmol/L (ref 101–111)
Creatinine, Ser: 0.6 mg/dL — ABNORMAL LOW (ref 0.61–1.24)
Glucose, Bld: 90 mg/dL (ref 65–99)
HCT: 43 % (ref 39.0–52.0)
Hemoglobin: 14.6 g/dL (ref 13.0–17.0)
Potassium: 3.7 mmol/L (ref 3.5–5.1)
Sodium: 143 mmol/L (ref 135–145)
TCO2: 27 mmol/L (ref 0–100)

## 2016-04-20 LAB — POC OCCULT BLOOD, ED: FECAL OCCULT BLD: NEGATIVE

## 2016-04-20 MED ORDER — METHOCARBAMOL 500 MG PO TABS: 1000.0000 mg | ORAL_TABLET | Freq: Four times a day (QID) | ORAL | Status: DC | PRN

## 2016-04-20 MED ORDER — SODIUM CHLORIDE 0.9 % IV BOLUS (SEPSIS)
1000.0000 mL | Freq: Once | INTRAVENOUS | Status: AC
  Administered 2016-04-20: 1000 mL via INTRAVENOUS

## 2016-04-20 MED ORDER — DIPHENHYDRAMINE HCL 50 MG/ML IJ SOLN
25.0000 mg | Freq: Once | INTRAMUSCULAR | Status: AC
  Administered 2016-04-20: 25 mg via INTRAVENOUS
  Filled 2016-04-20: qty 1

## 2016-04-20 MED ORDER — PROCHLORPERAZINE EDISYLATE 5 MG/ML IJ SOLN
5.0000 mg | Freq: Once | INTRAMUSCULAR | Status: AC
  Administered 2016-04-20: 5 mg via INTRAVENOUS
  Filled 2016-04-20: qty 2

## 2016-04-20 MED ORDER — NAPROXEN 500 MG PO TABS
500.0000 mg | ORAL_TABLET | Freq: Two times a day (BID) | ORAL | Status: DC
Start: 2016-04-20 — End: 2016-09-06

## 2016-04-20 MED ORDER — IOPAMIDOL (ISOVUE-300) INJECTION 61%
INTRAVENOUS | Status: AC
  Administered 2016-04-20: 100 mL
  Filled 2016-04-20: qty 100

## 2016-04-20 NOTE — ED Notes (Signed)
Pt here for HA and back pain x 3 days. sts taking BC powders without relief,

## 2016-04-20 NOTE — Discharge Instructions (Signed)
Medications: Robaxin, Naprosyn  Treatment: Take Robaxin as prescribed for your muscle soreness. Take Naprosyn twice daily for your back and leg pain. Do the back exercises and stretches below as tolerated.  Follow-up: Please follow-up with Dr. Clarice Pole office for further evaluation and treatment of your back pain. Please call the number circled on your discharge paperwork to establish care with a primary care provider for further evaluation and treatment and to set up a colonoscopy. Please return to the emergency department if you develop any new or worsening symptoms.   Chronic Back Pain  When back pain lasts longer than 3 months, it is called chronic back pain.People with chronic back pain often go through certain periods that are more intense (flare-ups).  CAUSES Chronic back pain can be caused by wear and tear (degeneration) on different structures in your back. These structures include: 1. The bones of your spine (vertebrae) and the joints surrounding your spinal cord and nerve roots (facets). 2. The strong, fibrous tissues that connect your vertebrae (ligaments). Degeneration of these structures may result in pressure on your nerves. This can lead to constant pain. HOME CARE INSTRUCTIONS 1. Avoid bending, heavy lifting, prolonged sitting, and activities which make the problem worse. 2. Take brief periods of rest throughout the day to reduce your pain. Lying down or standing usually is better than sitting while you are resting. 3. Take over-the-counter or prescription medicines only as directed by your caregiver. SEEK IMMEDIATE MEDICAL CARE IF:  1. You have weakness or numbness in one of your legs or feet. 2. You have trouble controlling your bladder or bowels. 3. You have nausea, vomiting, abdominal pain, shortness of breath, or fainting.   This information is not intended to replace advice given to you by your health care provider. Make sure you discuss any questions you have with your  health care provider.   Document Released: 12/01/2004 Document Revised: 01/16/2012 Document Reviewed: 04/13/2015 Elsevier Interactive Patient Education 2016 [employer]  Back Exercises The following exercises strengthen the muscles that help to support the back. They also help to keep the lower back flexible. Doing these exercises can help to prevent back pain or lessen existing pain. If you have back pain or discomfort, try doing these exercises 2-3 times each day or as told by your health care provider. When the pain goes away, do them once each day, but increase the number of times that you repeat the steps for each exercise (do more repetitions). If you do not have back pain or discomfort, do these exercises once each day or as told by your health care provider. EXERCISES Single Knee to Chest Repeat these steps 3-5 times for each leg: 3. Lie on your back on a firm bed or the floor with your legs extended. 4. Bring one knee to your chest. Your other leg should stay extended and in contact with the floor. 5. Hold your knee in place by grabbing your knee or thigh. 6. Pull on your knee until you feel a gentle stretch in your lower back. 7. Hold the stretch for 10-30 seconds. 8. Slowly release and straighten your leg. Pelvic Tilt Repeat these steps 5-10 times: 4. Lie on your back on a firm bed or the floor with your legs extended. 5. Bend your knees so they are pointing toward the ceiling and your feet are flat on the floor. 6. Tighten your lower abdominal muscles to press your lower back against the floor. This motion will tilt your pelvis so your  tailbone points up toward the ceiling instead of pointing to your feet or the floor. 7. With gentle tension and even breathing, hold this position for 5-10 seconds. Cat-Cow Repeat these steps until your lower back becomes more flexible: 4. Get into a hands-and-knees position on a firm surface. Keep your hands under your shoulders, and keep your  knees under your hips. You may place padding under your knees for comfort. 5. Let your head hang down, and point your tailbone toward the floor so your lower back becomes rounded like the back of a cat. 6. Hold this position for 5 seconds. 7. Slowly lift your head and point your tailbone up toward the ceiling so your back forms a sagging arch like the back of a cow. 8. Hold this position for 5 seconds. Press-Ups Repeat these steps 5-10 times: 1. Lie on your abdomen (face-down) on the floor. 2. Place your palms near your head, about shoulder-width apart. 3. While you keep your back as relaxed as possible and keep your hips on the floor, slowly straighten your arms to raise the top half of your body and lift your shoulders. Do not use your back muscles to raise your upper torso. You may adjust the placement of your hands to make yourself more comfortable. 4. Hold this position for 5 seconds while you keep your back relaxed. 5. Slowly return to lying flat on the floor. Bridges Repeat these steps 10 times: 1. Lie on your back on a firm surface. 2. Bend your knees so they are pointing toward the ceiling and your feet are flat on the floor. 3. Tighten your buttocks muscles and lift your buttocks off of the floor until your waist is at almost the same height as your knees. You should feel the muscles working in your buttocks and the back of your thighs. If you do not feel these muscles, slide your feet 1-2 inches farther away from your buttocks. 4. Hold this position for 3-5 seconds. 5. Slowly lower your hips to the starting position, and allow your buttocks muscles to relax completely. If this exercise is too easy, try doing it with your arms crossed over your chest. Abdominal Crunches Repeat these steps 5-10 times: 1. Lie on your back on a firm bed or the floor with your legs extended. 2. Bend your knees so they are pointing toward the ceiling and your feet are flat on the floor. 3. Cross your  arms over your chest. 4. Tip your chin slightly toward your chest without bending your neck. 5. Tighten your abdominal muscles and slowly raise your trunk (torso) high enough to lift your shoulder blades a tiny bit off of the floor. Avoid raising your torso higher than that, because it can put too much stress on your low back and it does not help to strengthen your abdominal muscles. 6. Slowly return to your starting position. Back Lifts Repeat these steps 5-10 times: 1. Lie on your abdomen (face-down) with your arms at your sides, and rest your forehead on the floor. 2. Tighten the muscles in your legs and your buttocks. 3. Slowly lift your chest off of the floor while you keep your hips pressed to the floor. Keep the back of your head in line with the curve in your back. Your eyes should be looking at the floor. 4. Hold this position for 3-5 seconds. 5. Slowly return to your starting position. SEEK MEDICAL CARE IF:  Your back pain or discomfort gets much worse when you do  an exercise.  Your back pain or discomfort does not lessen within 2 hours after you exercise. If you have any of these problems, stop doing these exercises right away. Do not do them again unless your health care provider says that you can. SEEK IMMEDIATE MEDICAL CARE IF:  You develop sudden, severe back pain. If this happens, stop doing the exercises right away. Do not do them again unless your health care provider says that you can.   This information is not intended to replace advice given to you by your health care provider. Make sure you discuss any questions you have with your health care provider.   Document Released: 12/01/2004 Document Revised: 07/15/2015 Document Reviewed: 12/18/2014 Elsevier Interactive Patient Education [employer].

## 2016-04-20 NOTE — ED Provider Notes (Signed)
CSN: Bradley [name]:[id number]     Arrival date & time 04/20/16  1114 History   First MD Initiated Contact with Patient 04/20/16 1505     Chief Complaint  Patient presents with  . Headache  . Back Pain     (Consider location/radiation/quality/duration/timing/severity/associated sxs/prior Treatment) HPI Comments: Patient is a 55 year old male with history of hypertension and chronic back pain who presents with back pain, headache, abdominal pain, cough, lightheadedness for the past 3 days. Patient states that he has had an intermittent, frontal, throbbing headache that he rates as a 8/10. Patient is taking a Goody powder without relief. Patient also has had bilateral lower back pain with pain radiation to his right hip and associated left leg paresthesia. Patient denies any recent injury. The patient denies any fevers, saddle anesthesia, bowel or bladder incontinence, IVDA, cancer, surgeries. Patient reports he has had a periumbilical abdominal pain and nausea intermittently for the past 3 days. She also reports 2 episodes of bright red blood per rectum. Patient has no history of hemorrhoids. Patient reports one episode with blood just on the outside of the stool and one with throughout the toilet bowl. Patient's also reports intermittent lightheadedness. This has been associated with standing and lying down. Patient also reports shortness of breath intermittently at rest 4 months. Patient also reports some burning of urination for the past few days.  Patient is a 55 y.o. male presenting with headaches and back pain. The history is provided by the patient.  Headache Associated symptoms: abdominal pain, back pain, cough and nausea   Associated symptoms: no fever, no photophobia, no sore throat and no vomiting   Back Pain Associated symptoms: abdominal pain, dysuria and headaches   Associated symptoms: no chest pain and no fever     Past Medical History  Diagnosis Date  . Seasonal allergies   . Hypertension     . Chronic back pain   . Sciatica   . Kidney stones   . Chronic sinusitis   . Arthritis     "lower back by by butt; right shoulder" (12/10/2014)  . Tobacco abuse    Past Surgical History  Procedure Laterality Date  . Kidney stone surgery  ~ 1980    "laser"  . Incision and drainage abscess      "boils; under right arm, hip, side"  . Left heart catheterization with coronary angiogram N/A 12/12/2014    Procedure: LEFT HEART CATHETERIZATION WITH CORONARY ANGIOGRAM;  Surgeon: Burnell Blanks, MD;  Location: [facility] CATH LAB;  Service: Cardiovascular;  Laterality: N/A;   Family History  Problem Relation Age of Onset  . Coronary artery disease Father   . Coronary artery disease Mother   . Coronary artery disease Sister    Social History  Substance Use Topics  . Smoking status: Current Every Day Smoker -- 1.00 packs/day for 44 years    Types: Cigarettes  . Smokeless tobacco: Never Used  . Alcohol Use: 2.4 oz/week    4 Cans of beer per week    Review of Systems  Constitutional: Negative for fever and chills.  HENT: Negative for facial swelling and sore throat.   Eyes: Negative for photophobia and visual disturbance.  Respiratory: Positive for cough and shortness of breath.   Cardiovascular: Negative for chest pain.  Gastrointestinal: Positive for nausea, abdominal pain and blood in stool. Negative for vomiting.  Genitourinary: Positive for dysuria.  Musculoskeletal: Positive for back pain.  Skin: Negative for rash and wound.  Neurological: Positive for light-headedness and  headaches.  Psychiatric/Behavioral: The patient is not nervous/anxious.       Allergies  Review of patient's allergies indicates no known allergies.  Home Medications   Prior to Admission medications   Medication Sig Start Date End Date Taking? Authorizing Provider  PRESCRIPTION MEDICATION Take 1 tablet by mouth daily. unknown blood pressure medication, not on file at any pharmacy   Yes Historical  Provider, MD  amLODipine (NORVASC) 10 MG tablet Take 1 tablet (10 mg total) by mouth daily. Patient not taking: Reported on 04/20/2016 12/12/14   Orson Eva, MD  aspirin 81 MG chewable tablet Chew 1 tablet (81 mg total) by mouth daily. Patient not taking: Reported on 04/20/2016 12/13/14   Orson Eva, MD  hydrALAZINE (APRESOLINE) 50 MG tablet Take 1 tablet (50 mg total) by mouth every 8 (eight) hours. Patient not taking: Reported on 04/20/2016 12/12/14   Orson Eva, MD  ketotifen (ZADITOR) 0.025 % ophthalmic solution Place 1 drop into both eyes 2 (two) times daily. Patient not taking: Reported on 04/20/2016 01/28/15   Margarita Mail, PA-C  lidocaine (LIDODERM) 5 % Place 1 patch onto the skin daily. Remove & Discard patch within 12 hours or as directed by MD Patient not taking: Reported on 04/20/2016 01/28/15   Margarita Mail, PA-C  meclizine (ANTIVERT) 25 MG tablet Take 1 tablet (25 mg total) by mouth 3 (three) times daily as needed for dizziness. Patient not taking: Reported on 04/20/2016 12/12/14   Orson Eva, MD  methocarbamol (ROBAXIN) 500 MG tablet Take 2 tablets (1,000 mg total) by mouth 4 (four) times daily as needed (Pain). 04/20/16   Frederica Kuster, PA-C  metoprolol tartrate (LOPRESSOR) 25 MG tablet Take 1 tablet (25 mg total) by mouth 2 (two) times daily. Patient not taking: Reported on 04/20/2016 12/12/14   Orson Eva, MD  naproxen (NAPROSYN) 500 MG tablet Take 1 tablet (500 mg total) by mouth 2 (two) times daily with a meal. 04/20/16   Chakia Counts M Ronisha Herringshaw, PA-C   BP 185/113 mmHg  Pulse 56  Temp(Src) 98.2 F (36.8 C) (Oral)  Resp 16  SpO2 100% Physical Exam  Constitutional: He appears well-developed and well-nourished. No distress.  HENT:  Head: Normocephalic and atraumatic.  Mouth/Throat: Oropharynx is clear and moist. No oropharyngeal exudate.  Eyes: Conjunctivae and EOM are normal. Pupils are equal, round, and reactive to light. Right eye exhibits no discharge. Left eye exhibits no discharge. No scleral  icterus.  Neck: Normal range of motion. Neck supple. No thyromegaly present.  Cardiovascular: Normal rate, regular rhythm, normal heart sounds and intact distal pulses.  Exam reveals no gallop and no friction rub.   No murmur heard. Pulmonary/Chest: Effort normal and breath sounds normal. No stridor. No respiratory distress. He has no wheezes. He has no rales.  Abdominal: Soft. Bowel sounds are normal. He exhibits no distension. There is tenderness in the left lower quadrant. There is no rebound and no guarding.  Musculoskeletal: He exhibits no edema.       Back:       Legs: Tenderness to palpation to lumbar area, R>L; tenderness to palpation to right hip; -SLR bilaterally; pain with flexion and extension of lumbar spine; patient can heel raise and toe raise without pain or difficulty  Lymphadenopathy:    He has no cervical adenopathy.  Neurological: He is alert. Coordination normal.  CN 3-12 intact; patient feels paresthesia on light touch to left lateral thigh, however normal sensation throughout; 5/5 strength in all 4 extremities; equal bilateral grip  strength   Skin: Skin is warm and dry. No rash noted. He is not diaphoretic. No pallor.  Psychiatric: He has a normal mood and affect.  Nursing note and vitals reviewed.   ED Course  Procedures (including critical care time) Labs Review Labs Reviewed  URINALYSIS, ROUTINE W REFLEX MICROSCOPIC (NOT AT [facility]) - Abnormal; Notable for the following:    Hgb urine dipstick SMALL (*)    All other components within normal limits  URINE MICROSCOPIC-ADD ON - Abnormal; Notable for the following:    Squamous Epithelial / LPF 0-5 (*)    Bacteria, UA RARE (*)    All other components within normal limits  I-STAT CHEM 8, ED - Abnormal; Notable for the following:    Creatinine, Ser 0.60 (*)    All other components within normal limits  CBC WITH DIFFERENTIAL/PLATELET  POC OCCULT BLOOD, ED    Imaging Review Dg Chest 2 View  04/20/2016  CLINICAL  DATA:  Cough, unexplained BILATERAL upper leg pain for 3 days, shortness of breath for 4 months, history hypertension, Smoker EXAM: CHEST  2 VIEW COMPARISON:  12/10/2014 FINDINGS: Normal heart size, mediastinal contours, and pulmonary vascularity. Mild peribronchial thickening. No pulmonary infiltrate, pleural effusion, or pneumothorax. Bones unremarkable. IMPRESSION: Mild chronic bronchitic changes without infiltrate. Electronically Signed   By: Lavonia Dana M.D.   On: 04/20/2016 16:39   Ct Abdomen Pelvis W Contrast  04/20/2016  CLINICAL DATA:  Lower abdominal pain.  Lower back pain with nausea. EXAM: CT ABDOMEN AND PELVIS WITH CONTRAST TECHNIQUE: Multidetector CT imaging of the abdomen and pelvis was performed using the standard protocol following bolus administration of intravenous contrast. CONTRAST:  100 mL Isovue-300 COMPARISON:  CT 04/04/2008 and 02/11/2008 FINDINGS: Lower chest: Motion artifact at the lung bases. Few densities along the dependent lungs probably represent atelectasis. No large pleural effusions. Hepatobiliary: Normal appearance of the liver, gallbladder and portal venous system. Pancreas: Normal appearance of the pancreas without inflammation or duct dilatation. Spleen: Normal appearance of spleen without enlargement. Adrenals/Urinary Tract: Normal adrenal glands. Normal appearance of the kidneys without hydronephrosis. Small left renal cyst. Normal appearance of the urinary bladder. Stomach/Bowel: Scattered colonic diverticula without acute colonic inflammation. There appears to be a normal appendix. No evidence for a bowel obstruction. Vascular/Lymphatic: Mild atherosclerotic disease involving the aorta and iliac arteries. No aortic aneurysm. No suspicious lymphadenopathy in the abdomen or pelvis. Reproductive: Prostate is slightly heterogeneous with low-density in the peripheral aspect of the gland. There are central prostate calcifications. Prostate is prominent for size measuring 5.5  cm in transverse dimension. No gross abnormality to the seminal vesicles. Other: No free fluid. No free air. Small umbilical hernia containing fat. Musculoskeletal: Facet arthropathy in the lower lumbar spine. IMPRESSION: No acute abnormality in the abdomen or pelvis. Colonic diverticulosis. Enlarged prostate. Electronically Signed   By: Markus Daft M.D.   On: 04/20/2016 17:20   I have personally reviewed and evaluated these images and lab results as part of my medical decision-making.   EKG Interpretation None      MDM   CBC unremarkable. Chem-8 shows creatinine 0.60. UA shows small hematuria and rare bacteria. Fecal occult negative. Rectal exam shows large prostate, but no tenderness. CT abdomen pelvis shows no acute abnormality; colonic diverticulosis, and enlarged prostate. CXR shows mild chronic bronchitic changes without infiltrate. Patient reports he normally has symptoms such as his headache and nausea when he has his back pain. Headache resolved in ED with Compazine and Benadryl. Patient discharged with  naproxen and Robaxin with follow-up to neurosurgery. Patient also advised to establish care with a primary care provider for further evaluation and treatment of his symptoms, including most likely COPD as a cause of his intermittent shortness of breath. Patient is a current smoker. Patient states he does take his hypertension medications at home and I suspect that it was time for him to take them again as his blood pressure rose throughout his ED course. Patient discussed with and evaluated by Dr. Alvino Chapel who is in agreement with plan. Patient understands and is in agreement with plan.  Final diagnoses:  Acute nonintractable headache, unspecified headache type  Bilateral low back pain with right-sided sciatica        Frederica Kuster, PA-C 04/21/16 0036  Davonna Belling, MD 04/21/16 [phone]

## 2016-09-06 ENCOUNTER — Ambulatory Visit (INDEPENDENT_AMBULATORY_CARE_PROVIDER_SITE_OTHER): Payer: Medicaid Other | Admitting: Internal Medicine

## 2016-09-06 ENCOUNTER — Encounter: Payer: Self-pay | Admitting: Internal Medicine

## 2016-09-06 ENCOUNTER — Ambulatory Visit: Payer: Self-pay | Admitting: Internal Medicine

## 2016-09-06 VITALS — BP 160/100 | HR 60 | Resp 18 | Ht 67.5 in | Wt 166.5 lb

## 2016-09-06 DIAGNOSIS — R0789 Other chest pain: Secondary | ICD-10-CM | POA: Diagnosis not present

## 2016-09-06 DIAGNOSIS — R42 Dizziness and giddiness: Secondary | ICD-10-CM

## 2016-09-06 DIAGNOSIS — L308 Other specified dermatitis: Secondary | ICD-10-CM | POA: Diagnosis not present

## 2016-09-06 DIAGNOSIS — H18413 Arcus senilis, bilateral: Secondary | ICD-10-CM | POA: Diagnosis not present

## 2016-09-06 DIAGNOSIS — M7061 Trochanteric bursitis, right hip: Secondary | ICD-10-CM | POA: Diagnosis not present

## 2016-09-06 DIAGNOSIS — L309 Dermatitis, unspecified: Secondary | ICD-10-CM | POA: Insufficient documentation

## 2016-09-06 DIAGNOSIS — I10 Essential (primary) hypertension: Secondary | ICD-10-CM | POA: Insufficient documentation

## 2016-09-06 DIAGNOSIS — H547 Unspecified visual loss: Secondary | ICD-10-CM | POA: Diagnosis not present

## 2016-09-06 MED ORDER — AMLODIPINE BESYLATE 10 MG PO TABS: 10.0000 mg | ORAL_TABLET | Freq: Every day | ORAL | 11 refills | Status: DC

## 2016-09-06 MED ORDER — TRIAMCINOLONE ACETONIDE 0.1 % EX CREA: TOPICAL_CREAM | CUTANEOUS | 2 refills | Status: DC

## 2016-09-06 MED ORDER — ASPIRIN 81 MG PO CHEW: 81.0000 mg | CHEWABLE_TABLET | Freq: Every day | ORAL | 0 refills | Status: DC

## 2016-09-06 MED ORDER — NAPROXEN 500 MG PO TABS: 500.0000 mg | ORAL_TABLET | Freq: Two times a day (BID) | ORAL | 4 refills | Status: DC

## 2016-09-06 NOTE — Progress Notes (Signed)
   Subjective:    Patient ID: Bradley [name], male    DOB: 04-Jun-1961, 55 y.o.   MRN: [id number]  HPI   Here to establish  1.  Essential Hypertension:  Diagnosed 2 years ago.  Was taking an aspirin, Amlodipine, Metoprolol.  Could not get to a doctor to get a prescription to refill.  Sounds like it was well controlled on medication.  Did not have the dizzy episodes he has when on meds.  Describes being hospitalized last year for hypertensive crisis for 1 week.  Also, suffered chest pain with negative cardiac work up other than mild nonobstructive CAD with cardiac cath.  2. Difficulties with vision:  Has tried reading glasses, which help.  Describes fairly good distance vision.  Has not been to an eye doctor in some time.  3.  Chest Pain:  See #1:  When breathes in or yawns, has anterior sternal area chest pain.  Pain is there and gone with the movement.  4.  Low back pain:  Chronic for past 2 years.  Can move to right upper thigh and cause pain.  Sometimes, if not careful how he steps, could fall.  No outpatient prescriptions have been marked as taking for the 09/06/16 encounter (Office Visit) with Mack Hook, MD.   No Known Allergies  Review of Systems     Objective:   Physical Exam   HEENT:  PERRL, EOMI, Arcus senilis bilaterally, discs sharp, unable to see eyegrounds well, TMs pearly gray, throat without injection. Neck:  Supple, no adenopathy, no thyromegaly Chest:  CTA, tender over costal area along sternum bilaterally Abd:  S, NT, No HSM or mass, + BS  Skin:  Dry patches with cracking and scabbing at pants button, upper abdomen, anterior thighs lower legs  Point tenderness over right greater trochanter--reproduces pain down lateral leg      Assessment & Plan:  1.  Essential Hypertension:  Restart Amlodipine 10 mg and Metoprolol 25 mg twice daily.  Consider addition of Hydralazine as also listed in meds if not controlled with first two meds.  CMP, CBC  2.   Decreased Visual Acuity:  Referral to Ophthalmology.    3.  Arcus senilis:  FLP.  4.  Nonobstructive CAD by Cath 2016:  ASA 81 mg daily, FLP as above  5.  Chest wall pain:  Seems short lived and minimal-no treatment  6.  Bilateral Trochanteric Bursitis:  Naproxen 500 mg twice daily for 2 weeks then only as needed.  Stretches given to perform twice daily.  Right hip bursa injection in 3 weeks with bp followup   7.  Eczema and likely nickel allergy to belt buckle/pants snap:  Protect skin from metal in clothing and belts.  Triamcinolone cream twice day as needed as well as Eucerin Cream for eczema relief twice daily all over.

## 2016-09-06 NOTE — Patient Instructions (Signed)
Can google "advance directives, [place]"  And bring up form from Secretary of State. Print and fill out Or can go to "5 wishes"  Which is also in Spanish and fill out--this costs $5--perhaps easier to use. Designate a Medical Power of Attorney to speak for you if you are unable to speak for yourself when ill or injured  

## 2016-09-07 LAB — COMPREHENSIVE METABOLIC PANEL
A/G RATIO: 1.4 (ref 1.2–2.2)
ALBUMIN: 4.4 g/dL (ref 3.5–5.5)
ALT: 16 IU/L (ref 0–44)
AST: 16 IU/L (ref 0–40)
Alkaline Phosphatase: 86 IU/L (ref 39–117)
BILIRUBIN TOTAL: 0.4 mg/dL (ref 0.0–1.2)
BUN / CREAT RATIO: 20 (ref 9–20)
BUN: 17 mg/dL (ref 6–24)
CHLORIDE: 107 mmol/L — AB (ref 96–106)
CO2: 26 mmol/L (ref 18–29)
Calcium: 9.7 mg/dL (ref 8.7–10.2)
Creatinine, Ser: 0.85 mg/dL (ref 0.76–1.27)
GFR calc non Af Amer: 98 mL/min/{1.73_m2} (ref >59)
GFR, EST AFRICAN AMERICAN: 113 mL/min/{1.73_m2} (ref >59)
GLOBULIN, TOTAL: 3.1 g/dL (ref 1.5–4.5)
Glucose: 84 mg/dL (ref 65–99)
Potassium: 5.4 mmol/L — ABNORMAL HIGH (ref 3.5–5.2)
Sodium: 146 mmol/L — ABNORMAL HIGH (ref 134–144)
TOTAL PROTEIN: 7.5 g/dL (ref 6.0–8.5)

## 2016-09-07 LAB — CBC WITH DIFFERENTIAL/PLATELET
BASOS ABS: 0 10*3/uL (ref 0.0–0.2)
Basos: 0 %
EOS (ABSOLUTE): 0.2 10*3/uL (ref 0.0–0.4)
Eos: 3 %
HEMATOCRIT: 44.3 % (ref 37.5–51.0)
HEMOGLOBIN: 14.7 g/dL (ref 12.6–17.7)
Immature Grans (Abs): 0 10*3/uL (ref 0.0–0.1)
Immature Granulocytes: 0 %
LYMPHS ABS: 2.3 10*3/uL (ref 0.7–3.1)
Lymphs: 32 %
MCH: 30.7 pg (ref 26.6–33.0)
MCHC: 33.2 g/dL (ref 31.5–35.7)
MCV: 93 fL (ref 79–97)
MONOCYTES: 8 %
Monocytes Absolute: 0.6 10*3/uL (ref 0.1–0.9)
NEUTROS ABS: 4.1 10*3/uL (ref 1.4–7.0)
Neutrophils: 57 %
Platelets: 290 10*3/uL (ref 150–379)
RBC: 4.79 x10E6/uL (ref 4.14–5.80)
RDW: 13.4 % (ref 12.3–15.4)
WBC: 7.1 10*3/uL (ref 3.4–10.8)

## 2016-09-07 LAB — LIPID PANEL W/O CHOL/HDL RATIO
CHOLESTEROL TOTAL: 202 mg/dL — AB (ref 100–199)
HDL: 41 mg/dL (ref >39)
LDL CALC: 147 mg/dL — AB (ref 0–99)
Triglycerides: 70 mg/dL (ref 0–149)
VLDL Cholesterol Cal: 14 mg/dL (ref 5–40)

## 2016-09-07 NOTE — Progress Notes (Signed)
Pat called patient.  Phone not accepting calls at this time.  Will keep trying to notify patient.

## 2016-09-13 NOTE — Progress Notes (Signed)
Fraser Din has called several times to deliver message from Dr. Amil Amen.  No answer.

## 2016-10-17 ENCOUNTER — Ambulatory Visit: Payer: Medicaid Other | Admitting: Internal Medicine

## 2016-10-27 ENCOUNTER — Ambulatory Visit: Payer: Medicaid Other | Admitting: Internal Medicine

## 2016-12-09 ENCOUNTER — Emergency Department (HOSPITAL_COMMUNITY)
Admission: EM | Admit: 2016-12-09 | Discharge: 2016-12-09 | Disposition: A | Discharge status: Home / Self Care | Payer: Self-pay | Attending: Emergency Medicine | Admitting: Emergency Medicine

## 2016-12-09 ENCOUNTER — Emergency Department (HOSPITAL_COMMUNITY): Payer: Self-pay

## 2016-12-09 ENCOUNTER — Encounter (HOSPITAL_COMMUNITY): Payer: Self-pay | Admitting: *Deleted

## 2016-12-09 DIAGNOSIS — I10 Essential (primary) hypertension: Secondary | ICD-10-CM | POA: Insufficient documentation

## 2016-12-09 DIAGNOSIS — K922 Gastrointestinal hemorrhage, unspecified: Secondary | ICD-10-CM | POA: Insufficient documentation

## 2016-12-09 DIAGNOSIS — Z79899 Other long term (current) drug therapy: Secondary | ICD-10-CM | POA: Insufficient documentation

## 2016-12-09 DIAGNOSIS — Z7982 Long term (current) use of aspirin: Secondary | ICD-10-CM | POA: Insufficient documentation

## 2016-12-09 DIAGNOSIS — F17210 Nicotine dependence, cigarettes, uncomplicated: Secondary | ICD-10-CM | POA: Insufficient documentation

## 2016-12-09 DIAGNOSIS — R109 Unspecified abdominal pain: Secondary | ICD-10-CM

## 2016-12-09 LAB — CBC WITH DIFFERENTIAL/PLATELET
BASOS PCT: 0 %
Basophils Absolute: 0 10*3/uL (ref 0.0–0.1)
EOS ABS: 0.2 10*3/uL (ref 0.0–0.7)
Eosinophils Relative: 3 %
HCT: 42.2 % (ref 39.0–52.0)
HEMOGLOBIN: 14.1 g/dL (ref 13.0–17.0)
Lymphocytes Relative: 31 %
Lymphs Abs: 2 10*3/uL (ref 0.7–4.0)
MCH: 30.7 pg (ref 26.0–34.0)
MCHC: 33.4 g/dL (ref 30.0–36.0)
MCV: 91.7 fL (ref 78.0–100.0)
MONOS PCT: 5 %
Monocytes Absolute: 0.3 10*3/uL (ref 0.1–1.0)
NEUTROS PCT: 61 %
Neutro Abs: 3.9 10*3/uL (ref 1.7–7.7)
Platelets: 267 10*3/uL (ref 150–400)
RBC: 4.6 MIL/uL (ref 4.22–5.81)
RDW: 13.8 % (ref 11.5–15.5)
WBC: 6.3 10*3/uL (ref 4.0–10.5)

## 2016-12-09 LAB — URINALYSIS, ROUTINE W REFLEX MICROSCOPIC
Bacteria, UA: NONE SEEN
Bilirubin Urine: NEGATIVE
GLUCOSE, UA: NEGATIVE mg/dL
KETONES UR: NEGATIVE mg/dL
LEUKOCYTES UA: NEGATIVE
NITRITE: NEGATIVE
PROTEIN: NEGATIVE mg/dL
Specific Gravity, Urine: 1.019 (ref 1.005–1.030)
Squamous Epithelial / LPF: NONE SEEN
pH: 6 (ref 5.0–8.0)

## 2016-12-09 LAB — COMPREHENSIVE METABOLIC PANEL
ALBUMIN: 3.7 g/dL (ref 3.5–5.0)
ALK PHOS: 67 U/L (ref 38–126)
ALT: 18 U/L (ref 17–63)
ANION GAP: 6 (ref 5–15)
AST: 21 U/L (ref 15–41)
BUN: 17 mg/dL (ref 6–20)
CALCIUM: 9 mg/dL (ref 8.9–10.3)
CHLORIDE: 109 mmol/L (ref 101–111)
CO2: 25 mmol/L (ref 22–32)
Creatinine, Ser: 1.01 mg/dL (ref 0.61–1.24)
GFR calc Af Amer: >60 mL/min (ref >60)
GFR calc non Af Amer: >60 mL/min (ref >60)
GLUCOSE: 94 mg/dL (ref 65–99)
POTASSIUM: 3.9 mmol/L (ref 3.5–5.1)
SODIUM: 140 mmol/L (ref 135–145)
Total Bilirubin: 0.6 mg/dL (ref 0.3–1.2)
Total Protein: 6.9 g/dL (ref 6.5–8.1)

## 2016-12-09 LAB — POC OCCULT BLOOD, ED: Fecal Occult Bld: POSITIVE — AB

## 2016-12-09 MED ORDER — MORPHINE SULFATE (PF) 4 MG/ML IV SOLN
6.0000 mg | Freq: Once | INTRAVENOUS | Status: AC
  Administered 2016-12-09: 6 mg via INTRAVENOUS
  Filled 2016-12-09: qty 2

## 2016-12-09 MED ORDER — ONDANSETRON HCL 4 MG/2ML IJ SOLN
4.0000 mg | Freq: Once | INTRAMUSCULAR | Status: AC
  Administered 2016-12-09: 4 mg via INTRAVENOUS
  Filled 2016-12-09: qty 2

## 2016-12-09 NOTE — ED Triage Notes (Signed)
Pt c/o R lower back pain onset x 3 wks, pain radiates down the R leg,  pt denies injury, pt reports pain with ambulation, MAE, A&O x4

## 2016-12-09 NOTE — ED Provider Notes (Signed)
[facility] DEPT Provider Note   CSN: [id number] Arrival date & time: 12/09/16  1045     History   Chief Complaint Chief Complaint  Patient presents with  . Back Pain  . Rectal Bleeding    HPI Bradley [name] is a 56 y.o. male.  HPI Patient presents to the emergency department with complaints of possible blood in the stool over the past 2 days.  He denies weakness or lightheadedness or syncope.  He also reports 3 weeks of right low back pain with some radiation down into his right groin in his right gluteal region.  Some of the pain also radiates down his right leg.  He reports the pain is worse with ambulation.  He denies fevers and chills.  No prior history of colonoscopy.  He is to read GI bleeding before.  He is not on any anticoagulants.  His pain is moderate in severity.  Unrelieved with OTC medications to this point.  No other complaints   Past Medical History:  Diagnosis Date  . Arthritis    "lower back by by butt; right shoulder" (12/10/2014)  . Chronic back pain   . Chronic sinusitis   . Hypertension 2015  . Kidney stones Teen   Lithotripsy her believes on the right  . Sciatica   . Seasonal allergies   . Tobacco abuse     Patient Active Problem List   Diagnosis Date Noted  . Essential hypertension 09/06/2016  . Eczema 09/06/2016  . Pain in the chest   . Hypertensive urgency 12/10/2014  . Dizziness 12/10/2014  . Chest pain 12/10/2014  . BENIGN NEOPLASM OTHER&UNSPECIFIED PARTS MOUTH 11/11/2009  . ABSCESS, AXILLA, RIGHT 11/11/2009  . DERMATITIS, ALLERGIC 11/11/2009    Past Surgical History:  Procedure Laterality Date  . INCISION AND DRAINAGE ABSCESS     "boils; under right arm, hip, side"  . KIDNEY STONE SURGERY  ~ 1980   Lithotripsy to right, he believes  . LEFT HEART CATHETERIZATION WITH CORONARY ANGIOGRAM N/A 12/12/2014   Procedure: LEFT HEART CATHETERIZATION WITH CORONARY ANGIOGRAM;  Surgeon: Burnell Blanks, MD;  Location[facility] C[facility] LAB;   Service: Cardiovascular;  Laterality: N/A;       Home Medications    Prior to Admission medications   Medication Sig Start Date End Date Taking? Authorizing Provider  amLODipine (NORVASC) 10 MG tablet Take 1 tablet (10 mg total) by mouth daily. 09/06/16  Yes Mack Hook, MD  hydrocortisone cream 1 % Apply 1 application topically as needed for itching.   Yes Historical Provider, MD  aspirin 81 MG chewable tablet Chew 1 tablet (81 mg total) by mouth daily. Patient not taking: Reported on 12/09/2016 09/06/16   Mack Hook, MD  metoprolol tartrate (LOPRESSOR) 25 MG tablet Take 1 tablet (25 mg total) by mouth 2 (two) times daily. Patient not taking: Reported on 09/06/2016 12/12/14   Orson Eva, MD    Family History Family History  Problem Relation Age of Onset  . Coronary artery disease Father   . Cancer Father     Hodgkin's Lymphoma--cause of death  . Coronary artery disease Mother     Died of MI  . Diabetes Mother   . Hypertension Mother   . Hyperlipidemia Mother   . Coronary artery disease Sister 21    CABG  . Diabetes Sister   . Hypertension Sister   . Hyperlipidemia Sister   . Cancer Brother 56    Prostate Cancer  . Seizures Son     Father  describes absence seizures  . Diabetes Sister   . Hypertension Sister   . Hyperlipidemia Sister   . Coronary artery disease Brother   . Hypertension Brother     Social History Social History  Substance Use Topics  . Smoking status: Current Every Day Smoker    Packs/day: 0.10    Years: 44.00    Types: Cigarettes  . Smokeless tobacco: Never Used     Comment: Has not tried anything but cold [place]  . Alcohol use 2.4 oz/week    4 Cans of beer per week     Comment: No alcohol for 2 years.  Never abused.     Allergies   Patient has no known allergies.   Review of Systems Review of Systems  All other systems reviewed and are negative.    Physical Exam Updated Vital Signs BP 132/95 (BP Location: Right Arm)    Pulse 62   Temp 98.2 F (36.8 C) (Oral)   Resp 16   SpO2 100%   Physical Exam  Constitutional: He is oriented to person, place, and time. He appears well-developed and well-nourished.  HENT:  Head: Normocephalic and atraumatic.  Eyes: EOM are normal.  Neck: Normal range of motion.  Cardiovascular: Normal rate, regular rhythm, normal heart sounds and intact distal pulses.   Pulmonary/Chest: Effort normal and breath sounds normal. No respiratory distress.  Abdominal: Soft. He exhibits no distension.  No hernia noted in the right inguinal region.  No peritoneal signs.  Mild tenderness right lower quadrant.  Genitourinary:  Genitourinary Comments: Normal external rectal examination.  Digital rectal exam without gross blood.  Stool brown.  Trace Hemoccult positive.  No mass  Musculoskeletal: Normal range of motion.  Neurological: He is alert and oriented to person, place, and time.  Skin: Skin is warm and dry.  Psychiatric: He has a normal mood and affect. Judgment normal.  Nursing note and vitals reviewed.    ED Treatments / Results  Labs (all labs ordered are listed, but only abnormal results are displayed) Labs Reviewed  URINALYSIS, ROUTINE W REFLEX MICROSCOPIC - Abnormal; Notable for the following:       Result Value   Hgb urine dipstick SMALL (*)    All other components within normal limits  POC OCCULT BLOOD, ED - Abnormal; Notable for the following:    Fecal Occult Bld POSITIVE (*)    All other components within normal limits  CBC WITH DIFFERENTIAL/PLATELET  COMPREHENSIVE METABOLIC PANEL    EKG  EKG Interpretation None       Radiology Ct Renal Stone Study  Result Date: 12/09/2016 CLINICAL DATA:  Right-sided flank pain for several weeks EXAM: CT ABDOMEN AND PELVIS WITHOUT CONTRAST TECHNIQUE: Multidetector CT imaging of the abdomen and pelvis was performed following the standard protocol without IV contrast. COMPARISON:  04/20/2016 FINDINGS: Lower chest: Dependent  atelectatic changes are noted. Mild pleural effusion is noted in the right base. Hepatobiliary: No focal liver abnormality is seen. No gallstones, gallbladder wall thickening, or biliary dilatation. Pancreas: Unremarkable. No pancreatic ductal dilatation or surrounding inflammatory changes. Spleen: Normal in size without focal abnormality. Adrenals/Urinary Tract: The right adrenal gland is within normal limits. The left adrenal gland demonstrates central fullness measuring approximately 14 mm in dimension. This would be consistent with a small adenoma. The kidneys demonstrate small nonobstructing stones on the right. No obstructive changes are seen. Stomach/Bowel: Diverticular change of the colon is noted. The appendix is within normal limits. No obstructive changes are seen. Vascular/Lymphatic: Aortic atherosclerosis.  No enlarged abdominal or pelvic lymph nodes. Reproductive: Prostate is unremarkable. Other: No abdominal wall hernia or abnormality. No abdominopelvic ascites. Musculoskeletal: Degenerative changes of lumbar spine are noted. IMPRESSION: Small right pleural effusion. Nonobstructing right renal calculi. Diverticulosis without diverticulitis. Small left adrenal adenoma. Electronically Signed   By: Inez Catalina M.D.   On: 12/09/2016 13:41    Procedures Procedures (including critical care time)  Medications Ordered in ED Medications  morphine 4 MG/ML injection 6 mg (6 mg Intravenous Given 12/09/16 1237)  ondansetron (ZOFRAN) injection 4 mg (4 mg Intravenous Given 12/09/16 1249)     Initial Impression / Assessment and Plan / ED Course  I have reviewed the triage vital signs and the nursing notes.  Pertinent labs & imaging results that were available during my care of the patient were reviewed by me and considered in my medical decision making (see chart for details).     Pain improved.  Hemoglobin stable.  Vital signs stable.  No acute pathology noted on CT imaging.  Patient referred  outpatient GI for ongoing evaluation of the GI bleeding.  He may benefit from colonoscopy given the right-sided abdominal discomfort in the report of new rectal bleeding.  He understands to return to the ER for new or worsening symptoms  Final Clinical Impressions(s) / ED Diagnoses   Final diagnoses:  Right flank pain  Gastrointestinal hemorrhage, unspecified gastrointestinal hemorrhage type    New Prescriptions Current Discharge Medication List       Jola Schmidt, MD 12/09/16 1535

## 2016-12-09 NOTE — ED Triage Notes (Signed)
Pt c/o dark red blood in stool x 2 days ago, pt acuity changed d/t pt reporting this symtpom

## 2016-12-21 ENCOUNTER — Emergency Department (HOSPITAL_COMMUNITY)
Admission: EM | Admit: 2016-12-21 | Discharge: 2016-12-21 | Disposition: A | Discharge status: Home / Self Care | Payer: 59 | Attending: Emergency Medicine | Admitting: Emergency Medicine

## 2016-12-21 ENCOUNTER — Encounter (HOSPITAL_COMMUNITY): Payer: Self-pay | Admitting: Emergency Medicine

## 2016-12-21 DIAGNOSIS — J111 Influenza due to unidentified influenza virus with other respiratory manifestations: Secondary | ICD-10-CM | POA: Insufficient documentation

## 2016-12-21 DIAGNOSIS — Z79899 Other long term (current) drug therapy: Secondary | ICD-10-CM | POA: Insufficient documentation

## 2016-12-21 DIAGNOSIS — Z7982 Long term (current) use of aspirin: Secondary | ICD-10-CM | POA: Insufficient documentation

## 2016-12-21 DIAGNOSIS — F17210 Nicotine dependence, cigarettes, uncomplicated: Secondary | ICD-10-CM | POA: Insufficient documentation

## 2016-12-21 DIAGNOSIS — I10 Essential (primary) hypertension: Secondary | ICD-10-CM | POA: Insufficient documentation

## 2016-12-21 DIAGNOSIS — R69 Illness, unspecified: Secondary | ICD-10-CM

## 2016-12-21 MED ORDER — ONDANSETRON HCL 4 MG PO TABS: 4.0000 mg | ORAL_TABLET | Freq: Three times a day (TID) | ORAL | 0 refills | Status: DC | PRN

## 2016-12-21 MED ORDER — OSELTAMIVIR PHOSPHATE 75 MG PO CAPS: 75.0000 mg | ORAL_CAPSULE | Freq: Two times a day (BID) | ORAL | 0 refills | Status: DC

## 2016-12-21 MED ORDER — BENZONATATE 100 MG PO CAPS: 200.0000 mg | ORAL_CAPSULE | Freq: Two times a day (BID) | ORAL | 0 refills | Status: DC | PRN

## 2016-12-21 NOTE — ED Triage Notes (Signed)
Pt from home with c/o body aches, chills, weakness, and productive cough.  Denies SOB,. NAD, A&O.

## 2016-12-21 NOTE — ED Provider Notes (Signed)
[facility] DEPT Provider Note   CSN: [id number] Arrival date & time: 12/21/16  [id number]  By signing my name below, I, Bradley [name], attest that this documentation has been prepared under the direction and in the presence of Bradley [name], [place]. Electronically Signed: Neta [name], ED Scribe. 12/21/2016. 9:18 AM.    History   Chief Complaint Chief Complaint  Patient presents with  . flu like symptoms    The history is provided by the patient. No language interpreter was used.   HPI Comments:  Bradley [name] is a 56 y.o. male with PMHx of HTN who presents to the Emergency Department complaining of multiple, constant flu-like symptoms x 2 days. Pt complains of associated chills, diaphoresis, productive cough, generalized body aches, fever, nausea. Pt did get a flu shot this year. Pt has taken Nyquil and Alka Seltzer Plus with mild relief for cough. Pt denies sore throat, vomiting.   Past Medical History:  Diagnosis Date  . Arthritis    "lower back by by butt; right shoulder" (12/10/2014)  . Chronic back pain   . Chronic sinusitis   . Hypertension 2015  . Kidney stones Teen   Lithotripsy her believes on the right  . Sciatica   . Seasonal allergies   . Tobacco abuse     Patient Active Problem List   Diagnosis Date Noted  . Essential hypertension 09/06/2016  . Eczema 09/06/2016  . Pain in the chest   . Hypertensive urgency 12/10/2014  . Dizziness 12/10/2014  . Chest pain 12/10/2014  . BENIGN NEOPLASM OTHER&UNSPECIFIED PARTS MOUTH 11/11/2009  . ABSCESS, AXILLA, RIGHT 11/11/2009  . DERMATITIS, ALLERGIC 11/11/2009    Past Surgical History:  Procedure Laterality Date  . INCISION AND DRAINAGE ABSCESS     "boils; under right arm, hip, side"  . KIDNEY STONE SURGERY  ~ 1980   Lithotripsy to right, he believes  . LEFT HEART CATHETERIZATION WITH CORONARY ANGIOGRAM N/A 12/12/2014   Procedure: LEFT HEART CATHETERIZATION WITH CORONARY ANGIOGRAM;  Surgeon: Burnell Blanks, MD;  Location: [facility] CATH LAB;  Service: Cardiovascular;  Laterality: N/A;       Home Medications    Prior to Admission medications   Medication Sig Start Date End Date Taking? Authorizing Provider  amLODipine (NORVASC) 10 MG tablet Take 1 tablet (10 mg total) by mouth daily. 09/06/16   Mack Hook, MD  aspirin 81 MG chewable tablet Chew 1 tablet (81 mg total) by mouth daily. Patient not taking: Reported on 12/09/2016 09/06/16   Mack Hook, MD  hydrocortisone cream 1 % Apply 1 application topically as needed for itching.    Historical Provider, MD  metoprolol tartrate (LOPRESSOR) 25 MG tablet Take 1 tablet (25 mg total) by mouth 2 (two) times daily. Patient not taking: Reported on 09/06/2016 12/12/14   Orson Eva, MD    Family History Family History  Problem Relation Age of Onset  . Coronary artery disease Father   . Cancer Father     Hodgkin's Lymphoma--cause of death  . Coronary artery disease Mother     Died of MI  . Diabetes Mother   . Hypertension Mother   . Hyperlipidemia Mother   . Coronary artery disease Sister 65    CABG  . Diabetes Sister   . Hypertension Sister   . Hyperlipidemia Sister   . Cancer Brother 47    Prostate Cancer  . Seizures Son     Father describes absence seizures  . Diabetes Sister   . Hypertension  Sister   . Hyperlipidemia Sister   . Coronary artery disease Brother   . Hypertension Brother     Social History Social History  Substance Use Topics  . Smoking status: Current Every Day Smoker    Packs/day: 0.10    Years: 44.00    Types: Cigarettes  . Smokeless tobacco: Never Used     Comment: Has not tried anything but cold [place]  . Alcohol use 2.4 oz/week    4 Cans of beer per week     Comment: No alcohol for 2 years.  Never abused.     Allergies   Patient has no known allergies.   Review of Systems Review of Systems  Constitutional: Positive for chills, diaphoresis and fever.  HENT: Negative for sore  throat.   Respiratory: Positive for cough.   Gastrointestinal: Positive for nausea. Negative for vomiting.  Musculoskeletal: Positive for myalgias.     Physical Exam Updated Vital Signs BP 119/88 (BP Location: Right Arm)   Pulse 85   Temp 99.1 F (37.3 C) (Oral)   Resp 20   Ht 5\' 6"  (1.676 m)   Wt 176 lb (79.8 kg)   SpO2 100%   BMI 28.41 kg/m   Physical Exam Nursing note and vitals reviewed.  Constitutional: Appears well-developed and well-nourished. No distress.  HENT: Oropharynx is mildly erythematous, no tonsillar exudates or abscess, airway intact. Eyes: Conjunctivae are normal. Pupils are equal, round, and reactive to light.  Neck: Normal range of motion and full passive range of motion without pain.  Cardiovascular: Normal rate (HR 85) and intact distal pulses.   Pulmonary/Chest: Effort normal and breath sounds normal. No stridor. No wheezes, rhonchi, or rales. Abdominal: Soft. There is no focal abdominal tenderness.  Musculoskeletal: Normal range of motion. Moves all extremities. Neurological: Pt is alert and oriented to person, place, and time. Sensation and strength grossly intact throughout. Skin: Skin is warm and dry. No rash noted. Pt is not diaphoretic.  Psychiatric: Normal mood and affect.    ED Treatments / Results  DIAGNOSTIC STUDIES:  Oxygen Saturation is 100% on RA, normal by my interpretation.    COORDINATION OF CARE:  9:12 AM Discussed treatment plan with pt at bedside and pt agreed to plan.   Labs (all labs ordered are listed, but only abnormal results are displayed) Labs Reviewed - No data to display  EKG  EKG Interpretation None       Radiology No results found.  Procedures Procedures (including critical care time)  Medications Ordered in ED Medications - No data to display   Initial Impression / Assessment and Plan / ED Course  I have reviewed the triage vital signs and the nursing notes.  Pertinent labs & imaging results  that were available during my care of the patient were reviewed by me and considered in my medical decision making (see chart for details).     Patient with flu like symptoms.  Onset 2 days ago.  Non toxic.  Stable appearing.  Will try tamiflu, tessalon, and zofran.  Rest and fluids encouraged.  Final Clinical Impressions(s) / ED Diagnoses   Final diagnoses:  Influenza-like illness    New Prescriptions New Prescriptions   BENZONATATE (TESSALON) 100 MG CAPSULE    Take 2 capsules (200 mg total) by mouth 2 (two) times daily as needed for cough.   ONDANSETRON (ZOFRAN) 4 MG TABLET    Take 1 tablet (4 mg total) by mouth every 8 (eight) hours as needed for nausea or  vomiting.   OSELTAMIVIR (TAMIFLU) 75 MG CAPSULE    Take 1 capsule (75 mg total) by mouth every 12 (twelve) hours.   I personally performed the services described in this documentation, which was scribed in my presence. The recorded information has been reviewed and is accurate.       Bradley Circle, PA-C 12/21/16 [id number]    Isla Pence, MD 12/21/16 [phone]

## 2016-12-21 NOTE — ED Notes (Signed)
Pt.wanted something to drink gave pt.gingerale the patient.stated his stomach was upset.

## 2017-01-19 ENCOUNTER — Encounter: Payer: Self-pay | Admitting: Internal Medicine

## 2017-01-19 ENCOUNTER — Ambulatory Visit (INDEPENDENT_AMBULATORY_CARE_PROVIDER_SITE_OTHER): Payer: Medicaid Other | Admitting: Internal Medicine

## 2017-01-19 VITALS — BP 142/100 | HR 62 | Resp 12 | Ht 68.25 in | Wt 162.0 lb

## 2017-01-19 DIAGNOSIS — M7711 Lateral epicondylitis, right elbow: Secondary | ICD-10-CM

## 2017-01-19 DIAGNOSIS — L309 Dermatitis, unspecified: Secondary | ICD-10-CM

## 2017-01-19 DIAGNOSIS — I10 Essential (primary) hypertension: Secondary | ICD-10-CM | POA: Diagnosis not present

## 2017-01-19 DIAGNOSIS — B86 Scabies: Secondary | ICD-10-CM | POA: Diagnosis not present

## 2017-01-19 MED ORDER — AMLODIPINE BESYLATE 10 MG PO TABS: 10.0000 mg | ORAL_TABLET | Freq: Every day | ORAL | 11 refills | Status: DC

## 2017-01-19 MED ORDER — METOPROLOL TARTRATE 25 MG PO TABS: 25.0000 mg | ORAL_TABLET | Freq: Two times a day (BID) | ORAL | 11 refills | Status: DC

## 2017-01-19 MED ORDER — IBUPROFEN 200 MG PO TABS: ORAL_TABLET | ORAL | 0 refills | Status: DC

## 2017-01-19 MED ORDER — TRIAMCINOLONE ACETONIDE 0.1 % EX CREA: TOPICAL_CREAM | CUTANEOUS | 1 refills | Status: DC

## 2017-01-19 MED ORDER — PERMETHRIN 5 % EX CREA: TOPICAL_CREAM | CUTANEOUS | 1 refills | Status: DC

## 2017-01-19 NOTE — Progress Notes (Signed)
   Subjective:    Patient ID: Bradley [name], male    DOB: 31-Jan-1961, 56 y.o.   MRN: [id number]  HPI   1.  Right radial radial forearm pain that radiates up to his shoulder.  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 No injury to arm or neck.    2.  Pruritic rash he gets with washing dishes and using bleach and detergents.  In his antecubital fossa and back of right fingers and hand.  Not using gloves that protect this well.  If does use gloves, water gets in them while washing. Also states the rash on his trunk and arms has been a problem for years.  His son has a diffuse rash we have been treating which he also has had for some time.  Both have itching.  3.  Essential Hypertension:  Taking Amlodipine 10 mg daily.  Not taking Metoprolol.  States does not have the money. Called pharmacy:  Has not picked up the amlodipine since November.  Never picked up the Metoprolol.  Current Meds  Medication Sig  . amLODipine (NORVASC) 10 MG tablet Take 1 tablet (10 mg total) by mouth daily.  . hydrocortisone cream 1 % Apply 1 application topically as needed for itching.    No Known Allergies    Review of Systems     Objective:   Physical Exam NAD Lungs:  CTA CV:  RRR with normal S1 and S2, No S3, S4 or murmur, radial pulses normal and equal Right arm:  Obvious swelling and tenderness over lateral epicondyle.  Tender along radial forearm musculature and tendons as well. Skin: Scattered scabbing on small red bases on anterior and posterior trunk, up arms, especially forearms.  Also with erythema and ulceration, flaking over dorsal right fingers and MCPs as well as medial and lateral aspects of fingers.       Assessment & Plan:  1.  Right Lateral Epicondylitis:  Air bladder arm splint.  Photos of options given.  To take Ibuprofen 800 mg twice daily with food for 7 days.  Showed patient how to pick up objects with thumb down.  2.   ?Scabies:  This may all be eczema, but with both patient and son with long term rashes, will go ahead and treat for scabies with Elimite 5%.  Discussed cleaning up home afterward and repeating in 1 week.  3.  Dyshidrotic eczema:  Protect skin again chemicals and water with rubber gloves.  Triamcinolone Cream  0.1% apply twice daily to affected areas.  4.  Essential Hypertension:  Refilled both Amlodipine and Metoprolol.  Strongly encouraged patient to fill and take them as prescribed.  Discussed increasing likelihood of stroke/MI/renal failure should he not get this under control. Follow up in 1 month.

## 2017-01-19 NOTE — Patient Instructions (Addendum)
Both of you apply Elimite from head to toe at bedtime.  Before washing off in the morning, wash all of clothes and all of bed clothes that have not been washed in hot water and dry in hot dryer.  Then take a shower.  Repeat in 1 week.  Wash everything again as well.   Use something similar to playtex gloves that cover above elbow if possible when washing dishes or using chemicals

## 2017-01-27 ENCOUNTER — Telehealth: Payer: Self-pay | Admitting: Internal Medicine

## 2017-01-27 NOTE — Telephone Encounter (Signed)
See notes in routing.

## 2017-03-01 ENCOUNTER — Encounter: Payer: Self-pay | Admitting: Internal Medicine

## 2017-03-01 ENCOUNTER — Ambulatory Visit: Payer: Medicaid Other | Admitting: Internal Medicine

## 2017-03-01 VITALS — BP 122/82 | HR 70 | Resp 12 | Ht 68.25 in | Wt 165.0 lb

## 2017-03-01 DIAGNOSIS — M25511 Pain in right shoulder: Secondary | ICD-10-CM | POA: Diagnosis not present

## 2017-03-01 DIAGNOSIS — M7711 Lateral epicondylitis, right elbow: Secondary | ICD-10-CM

## 2017-03-01 DIAGNOSIS — L309 Dermatitis, unspecified: Secondary | ICD-10-CM

## 2017-03-01 DIAGNOSIS — I10 Essential (primary) hypertension: Secondary | ICD-10-CM

## 2017-03-01 MED ORDER — PREDNISONE 20 MG PO TABS: ORAL_TABLET | ORAL | 0 refills | Status: DC

## 2017-03-01 MED ORDER — TRIAMCINOLONE ACETONIDE 0.1 % EX CREA: TOPICAL_CREAM | CUTANEOUS | 1 refills | Status: DC

## 2017-03-01 NOTE — Progress Notes (Signed)
   Subjective:    Patient ID: Bradley [name], male    DOB: [birth year], 56 y.o.   MRN: [id number]  HPI   Right Lateral Epicondylitis:  The arms splint worked very well for him.  Used the splint for about 3 weeks and then lost it.  Since losing the splint, his pain is much worse.   Now with pain radiating up into his shoulder.   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  Current Meds  Medication Sig  . amLODipine (NORVASC) 10 MG tablet Take 1 tablet (10 mg total) by mouth daily.  . hydrocortisone cream 1 % Apply 1 application topically as needed for itching.  . metoprolol tartrate (LOPRESSOR) 25 MG tablet Take 1 tablet (25 mg total) by mouth 2 (two) times daily.   No Known Allergies    Review of Systems     Objective:   Physical Exam  Right lateral epicondyle very hypertropic and tender.  No bogginess.  Tender along tendon into forearm.   NT over CC and AC joint.  Mild tenderness over subacromial bursa. Thickened skin on lateral anterior right thigh.  Mild fissuring.  Arms without significant rash.  Rash on abdomen resolved as well.  Just with hyperpigmented coloring in spots.      Assessment & Plan:  1.  Right Lateral Epicondylitis:  Prednisone 40 mg daily for 5 days. Referral to PT [facility].  Needs to get another arm splint.  2.  Essential Hypertension:  Controlled for first time.  3.  Scabies:  Rash much improved--not clear if this was scabies or simply his eczema.  4.  Eczema:  Much improved--Continue Triamcinolone twice daily to patches.  Return in 3 months for CPE

## 2017-05-31 ENCOUNTER — Encounter: Payer: Self-pay | Admitting: Internal Medicine

## 2017-05-31 ENCOUNTER — Ambulatory Visit (INDEPENDENT_AMBULATORY_CARE_PROVIDER_SITE_OTHER): Payer: Medicaid Other | Admitting: Internal Medicine

## 2017-05-31 VITALS — BP 130/88 | HR 80 | Resp 12 | Ht 67.0 in | Wt 164.0 lb

## 2017-05-31 DIAGNOSIS — Z0000 Encounter for general adult medical examination without abnormal findings: Secondary | ICD-10-CM

## 2017-05-31 DIAGNOSIS — K921 Melena: Secondary | ICD-10-CM | POA: Diagnosis not present

## 2017-05-31 DIAGNOSIS — L309 Dermatitis, unspecified: Secondary | ICD-10-CM

## 2017-05-31 DIAGNOSIS — R079 Chest pain, unspecified: Secondary | ICD-10-CM

## 2017-05-31 DIAGNOSIS — F32A Depression, unspecified: Secondary | ICD-10-CM | POA: Insufficient documentation

## 2017-05-31 DIAGNOSIS — F329 Major depressive disorder, single episode, unspecified: Secondary | ICD-10-CM

## 2017-05-31 DIAGNOSIS — H9193 Unspecified hearing loss, bilateral: Secondary | ICD-10-CM | POA: Diagnosis not present

## 2017-05-31 DIAGNOSIS — N200 Calculus of kidney: Secondary | ICD-10-CM | POA: Insufficient documentation

## 2017-05-31 DIAGNOSIS — I10 Essential (primary) hypertension: Secondary | ICD-10-CM

## 2017-05-31 HISTORY — DX: Depression, unspecified: F32.A

## 2017-05-31 MED ORDER — FLUOXETINE HCL 10 MG PO TABS: ORAL_TABLET | ORAL | 3 refills | Status: DC

## 2017-05-31 MED ORDER — NITROGLYCERIN 0.4 MG SL SUBL: 0.4000 mg | SUBLINGUAL_TABLET | SUBLINGUAL | 0 refills | Status: AC | PRN

## 2017-05-31 MED ORDER — TRIAMCINOLONE ACETONIDE 0.1 % EX CREA: TOPICAL_CREAM | CUTANEOUS | 2 refills | Status: DC

## 2017-05-31 NOTE — Progress Notes (Addendum)
Subjective:    Patient ID: Bradley [name], male    DOB: 1961/11/07, 56 y.o.   MRN: [id number]  HPI   Here for Male CPE:  1.  STE:  Does not perform.  No family history.  2.  PSA/DRE:  Never tested.  Brother diagnosed with prostate cancer at 56 yo.  Died 6 months later.  3.  Guaiac Cards:  Never.  No family history  4.  Colonoscopy:  Never.    5.  Cholesterol/Glucose:  History of mildly elevated cholesterol and low HDL and normal fasting glucose in past. Lipid Panel     Component Value Date/Time   CHOL 202 (H) 09/06/2016 0947   TRIG 70 09/06/2016 0947   HDL 41 09/06/2016 0947   CHOLHDL 3.5 Ratio 07/09/2008 2104   VLDL 12 07/09/2008 2104   LDLCALC 147 (H) 09/06/2016 0947    6.  Immunizations: Cannot recall when had last tetanus  Immunization History  Administered Date(s) Administered  . Influenza-Unspecified 08/07/2014  . Pneumococcal Polysaccharide-23 12/11/2014      Review of Systems  Constitutional: Positive for fatigue. Negative for appetite change, fever and unexpected weight change.  HENT: Positive for dental problem (Needs some dental work, but does not believe his Medicaid covers) and hearing loss (Has been told he has hearing problems at work.  Has tinnitus bilaterally--ringing.).   Eyes: Positive for visual disturbance (Wears reading glasses.).  Respiratory: Positive for shortness of breath (associated with chest pain below.). Negative for cough.   Cardiovascular: Positive for chest pain (For 1 year.  Sharp pain in low sternum.  Hurts to take a deep breath.  Can last 15 minutes.  Can have at any time, at rest or with activity.  Has to sit and deep breathe until passes.  No radiation.  ). Negative for palpitations and leg swelling.  Gastrointestinal: Positive for blood in stool (Darker red 2 months ago.  Was passing blood when trying to pass stool.  Not painful from rectal area, but abdomen was cramping prior.  Has also had melena by his description as  well--cannot recall when, but in last year.), constipation and nausea (intermittent.  Maybe every other week., for about 1 month). Negative for abdominal pain, diarrhea, rectal pain and vomiting.  Genitourinary: Positive for decreased urine volume (no nocturia) and testicular pain (Recently with intermittent pain in one or the other testicle). Negative for discharge, dysuria, frequency, hematuria, penile pain and penile swelling.  Musculoskeletal:       Right arm pain to shoulder intermittently. Muscle cramps intermittently in hands and legs.  Skin: Positive for rash (eczema).  Neurological: Positive for weakness (numbness and weakness in right hand and arm--happens when using arm and wakes from sleep.  Starts with index finger of right hand).  Psychiatric/Behavioral: Positive for dysphoric mood and sleep disturbance (Wakes frequently throughout the night.). Negative for suicidal ideas. The patient is nervous/anxious.        Objective:   Physical Exam  Constitutional: He is oriented to person, place, and time. He appears well-developed and well-nourished.  HENT:  Head: Normocephalic and atraumatic.  Right Ear: Tympanic membrane, external ear and ear canal normal.  Left Ear: Tympanic membrane, external ear and ear canal normal.  Nose: Nose normal.  Mouth/Throat: Uvula is midline, oropharynx is clear and moist and mucous membranes are normal. Abnormal dentition (Significant plaque at gumline with receding gums.  Areas of decay).  Eyes: Pupils are equal, round, and reactive to light. Conjunctivae and EOM are normal.  Discs sharp bilaterally  Neck: Normal range of motion and full passive range of motion without pain. Neck supple. No thyromegaly present.  Cardiovascular: Normal rate, regular rhythm, S1 normal, S2 normal and normal heart sounds.  Exam reveals no S3, no S4 and no friction rub.   No murmur heard. No carotid bruits.  Carotid, radial, femoral, DP and PT pulses normal and equal.     Pulmonary/Chest: Effort normal and breath sounds normal.  Abdominal: Soft. Bowel sounds are normal. He exhibits no mass. There is no hepatosplenomegaly. There is no tenderness. No hernia. Hernia confirmed negative in the right inguinal area and confirmed negative in the left inguinal area.  Genitourinary: Rectum normal, testes normal and penis normal. Rectal exam shows no mass and guaiac negative stool (no stool--only mucous). Prostate is enlarged (mildly.  No mass). Prostate is not tender. Circumcised.  Musculoskeletal: Normal range of motion.  Lymphadenopathy:       Head (right side): No submental and no submandibular adenopathy present.       Head (left side): No submental and no submandibular adenopathy present.    He has no cervical adenopathy.    He has no axillary adenopathy.       Right: No inguinal and no supraclavicular adenopathy present.       Left: No inguinal and no supraclavicular adenopathy present.  Neurological: He is alert and oriented to person, place, and time. He has normal strength and normal reflexes. No cranial nerve deficit or sensory deficit. Coordination and gait normal.  Skin: Skin is warm and dry. No lesion noted.  Hands with deep vesicle along medial and lateral aspects of fingers and surrounding mild erythema/hyperpigmentation.  Also on dorsal aspects of some fingers  Psychiatric: He has a normal mood and affect. His speech is normal and behavior is normal. Thought content normal.    ECG:  NSR.  Appears normal and unchanged from 12/2014 ECG      Assessment & Plan:  1.  CPE Needs PSA-will return in 1 week for follow up and fasting labs:  PSA, FLP,CBC, CMP, TSH Likely BPH, but wait on lab Recommend influenza vaccine in fall. Did not get Tdap today--will give at follow up visit in 1 week.  2.  Depression:  Has never been treated before, but would be interested now.  Start Fluoxetine 10 mg daily for 7 days.  Increase to 20 mg thereafter.  Warm hand off to  [facility], [place].  Follow up with me in 1 week.  3.  Dental:  Has coverage with [place] [employer]--sheet given with list of dentists taking Medicaid.  4.  Decreased hearing with tinnitus: auditory referral  5.  History of hematochezia and melena:  GI referral.likely for both EGD and colonoscopy--diagnostic  6.  Atypical Chest Pain;  Sounds more almost pleuritic, but does have significant risk factors for CAD--ECG and referral to Cardiology.  Needs to get back on ASA 81 mg daily.  NTG prn.  7.  Essential Hypertension:  controlled  7.  Eczema:  Mainly involving hands:  Triamcinolone refilled.

## 2017-05-31 NOTE — Progress Notes (Signed)
[facility] screened for behavioral health and social determinants of health. Bradley [name] stated that he has been "stressed for many years."  He shared that he was stressed about finances and his son.  He stated that he has a hard time staying asleep a night and worries a lot. Bradley [name] does not report suicidal ideation. Bradley [name] stated to the [facility] that he has never thought about killing himself. [facility] offered counseling services and made an appointment with the LCSWA.  [facility] went over a few breathing and grounding techniques with Bradley [name].

## 2017-06-07 ENCOUNTER — Ambulatory Visit: Payer: Medicaid Other | Admitting: Internal Medicine

## 2017-06-07 ENCOUNTER — Encounter: Payer: Self-pay | Admitting: Gastroenterology

## 2017-06-07 ENCOUNTER — Other Ambulatory Visit: Payer: Self-pay | Admitting: Licensed Clinical Social Worker

## 2017-06-08 ENCOUNTER — Other Ambulatory Visit (INDEPENDENT_AMBULATORY_CARE_PROVIDER_SITE_OTHER): Payer: Self-pay

## 2017-06-08 DIAGNOSIS — Z0000 Encounter for general adult medical examination without abnormal findings: Secondary | ICD-10-CM

## 2017-06-09 LAB — CBC WITH DIFFERENTIAL/PLATELET
BASOS ABS: 0 10*3/uL (ref 0.0–0.2)
BASOS: 0 %
EOS (ABSOLUTE): 0.1 10*3/uL (ref 0.0–0.4)
Eos: 2 %
HEMATOCRIT: 43.4 % (ref 37.5–51.0)
Hemoglobin: 14.5 g/dL (ref 13.0–17.7)
IMMATURE GRANULOCYTES: 0 %
Immature Grans (Abs): 0 10*3/uL (ref 0.0–0.1)
Lymphocytes Absolute: 1.8 10*3/uL (ref 0.7–3.1)
Lymphs: 31 %
MCH: 31.3 pg (ref 26.6–33.0)
MCHC: 33.4 g/dL (ref 31.5–35.7)
MCV: 94 fL (ref 79–97)
MONOS ABS: 0.4 10*3/uL (ref 0.1–0.9)
Monocytes: 6 %
NEUTROS ABS: 3.5 10*3/uL (ref 1.4–7.0)
NEUTROS PCT: 61 %
Platelets: 270 10*3/uL (ref 150–379)
RBC: 4.63 x10E6/uL (ref 4.14–5.80)
RDW: 13.5 % (ref 12.3–15.4)
WBC: 5.8 10*3/uL (ref 3.4–10.8)

## 2017-06-09 LAB — COMPREHENSIVE METABOLIC PANEL
A/G RATIO: 1.7 (ref 1.2–2.2)
ALT: 21 IU/L (ref 0–44)
AST: 18 IU/L (ref 0–40)
Albumin: 4.3 g/dL (ref 3.5–5.5)
Alkaline Phosphatase: 66 IU/L (ref 39–117)
BILIRUBIN TOTAL: 0.6 mg/dL (ref 0.0–1.2)
BUN/Creatinine Ratio: 20 (ref 9–20)
BUN: 16 mg/dL (ref 6–24)
CALCIUM: 9.2 mg/dL (ref 8.7–10.2)
CHLORIDE: 109 mmol/L — AB (ref 96–106)
CO2: 22 mmol/L (ref 20–29)
Creatinine, Ser: 0.81 mg/dL (ref 0.76–1.27)
GFR calc Af Amer: 116 mL/min/{1.73_m2} (ref >59)
GFR, EST NON AFRICAN AMERICAN: 100 mL/min/{1.73_m2} (ref >59)
GLOBULIN, TOTAL: 2.6 g/dL (ref 1.5–4.5)
Glucose: 92 mg/dL (ref 65–99)
POTASSIUM: 4.3 mmol/L (ref 3.5–5.2)
SODIUM: 144 mmol/L (ref 134–144)
Total Protein: 6.9 g/dL (ref 6.0–8.5)

## 2017-06-09 LAB — LIPID PANEL W/O CHOL/HDL RATIO
Cholesterol, Total: 173 mg/dL (ref 100–199)
HDL: 39 mg/dL — AB (ref >39)
LDL Calculated: 120 mg/dL — ABNORMAL HIGH (ref 0–99)
TRIGLYCERIDES: 68 mg/dL (ref 0–149)
VLDL CHOLESTEROL CAL: 14 mg/dL (ref 5–40)

## 2017-06-09 LAB — PSA: Prostate Specific Ag, Serum: 2.3 ng/mL (ref 0.0–4.0)

## 2017-06-09 LAB — TSH: TSH: 1.05 u[IU]/mL (ref 0.450–4.500)

## 2017-07-05 ENCOUNTER — Ambulatory Visit: Payer: Self-pay | Admitting: Physician Assistant

## 2017-07-24 ENCOUNTER — Encounter: Payer: Self-pay | Admitting: Physician Assistant

## 2017-07-25 ENCOUNTER — Encounter: Payer: Self-pay | Admitting: Physician Assistant

## 2017-07-31 ENCOUNTER — Ambulatory Visit: Payer: 59 | Admitting: Gastroenterology

## 2017-08-04 ENCOUNTER — Encounter: Payer: Self-pay | Admitting: Internal Medicine

## 2017-09-18 ENCOUNTER — Ambulatory Visit: Payer: 59 | Attending: Audiology | Admitting: Audiology

## 2017-09-24 ENCOUNTER — Observation Stay (HOSPITAL_COMMUNITY)
Admission: EM | Admit: 2017-09-24 | Discharge: 2017-09-25 | Disposition: A | Discharge status: Home / Self Care | Payer: 59 | Attending: Internal Medicine | Admitting: Internal Medicine

## 2017-09-24 ENCOUNTER — Encounter (HOSPITAL_COMMUNITY): Payer: Self-pay

## 2017-09-24 ENCOUNTER — Other Ambulatory Visit: Payer: Self-pay

## 2017-09-24 ENCOUNTER — Emergency Department (HOSPITAL_COMMUNITY): Payer: 59

## 2017-09-24 DIAGNOSIS — Z8249 Family history of ischemic heart disease and other diseases of the circulatory system: Secondary | ICD-10-CM

## 2017-09-24 DIAGNOSIS — M79602 Pain in left arm: Secondary | ICD-10-CM | POA: Diagnosis not present

## 2017-09-24 DIAGNOSIS — K921 Melena: Secondary | ICD-10-CM | POA: Diagnosis not present

## 2017-09-24 DIAGNOSIS — I2510 Atherosclerotic heart disease of native coronary artery without angina pectoris: Secondary | ICD-10-CM

## 2017-09-24 DIAGNOSIS — F329 Major depressive disorder, single episode, unspecified: Secondary | ICD-10-CM | POA: Diagnosis not present

## 2017-09-24 DIAGNOSIS — F17210 Nicotine dependence, cigarettes, uncomplicated: Secondary | ICD-10-CM | POA: Diagnosis not present

## 2017-09-24 DIAGNOSIS — Z820 Family history of epilepsy and other diseases of the nervous system: Secondary | ICD-10-CM

## 2017-09-24 DIAGNOSIS — Z8042 Family history of malignant neoplasm of prostate: Secondary | ICD-10-CM

## 2017-09-24 DIAGNOSIS — Z23 Encounter for immunization: Secondary | ICD-10-CM | POA: Insufficient documentation

## 2017-09-24 DIAGNOSIS — Z79899 Other long term (current) drug therapy: Secondary | ICD-10-CM | POA: Diagnosis not present

## 2017-09-24 DIAGNOSIS — Z8349 Family history of other endocrine, nutritional and metabolic diseases: Secondary | ICD-10-CM | POA: Diagnosis not present

## 2017-09-24 DIAGNOSIS — Z7982 Long term (current) use of aspirin: Secondary | ICD-10-CM | POA: Diagnosis not present

## 2017-09-24 DIAGNOSIS — K625 Hemorrhage of anus and rectum: Secondary | ICD-10-CM | POA: Diagnosis not present

## 2017-09-24 DIAGNOSIS — I10 Essential (primary) hypertension: Secondary | ICD-10-CM | POA: Diagnosis not present

## 2017-09-24 DIAGNOSIS — L309 Dermatitis, unspecified: Secondary | ICD-10-CM | POA: Diagnosis not present

## 2017-09-24 DIAGNOSIS — R0789 Other chest pain: Secondary | ICD-10-CM | POA: Diagnosis not present

## 2017-09-24 DIAGNOSIS — R079 Chest pain, unspecified: Secondary | ICD-10-CM | POA: Diagnosis present

## 2017-09-24 DIAGNOSIS — Z833 Family history of diabetes mellitus: Secondary | ICD-10-CM | POA: Diagnosis not present

## 2017-09-24 DIAGNOSIS — Z807 Family history of other malignant neoplasms of lymphoid, hematopoietic and related tissues: Secondary | ICD-10-CM

## 2017-09-24 LAB — I-STAT TROPONIN, ED: Troponin i, poc: 0 ng/mL (ref 0.00–0.08)

## 2017-09-24 LAB — BASIC METABOLIC PANEL
Anion gap: 5 (ref 5–15)
BUN: 14 mg/dL (ref 6–20)
CHLORIDE: 109 mmol/L (ref 101–111)
CO2: 26 mmol/L (ref 22–32)
CREATININE: 0.81 mg/dL (ref 0.61–1.24)
Calcium: 9.5 mg/dL (ref 8.9–10.3)
GFR calc Af Amer: >60 mL/min (ref >60)
GFR calc non Af Amer: >60 mL/min (ref >60)
GLUCOSE: 94 mg/dL (ref 65–99)
POTASSIUM: 4.1 mmol/L (ref 3.5–5.1)
SODIUM: 140 mmol/L (ref 135–145)

## 2017-09-24 LAB — CBC
HEMATOCRIT: 45.5 % (ref 39.0–52.0)
Hemoglobin: 15.5 g/dL (ref 13.0–17.0)
MCH: 32 pg (ref 26.0–34.0)
MCHC: 34.1 g/dL (ref 30.0–36.0)
MCV: 93.8 fL (ref 78.0–100.0)
Platelets: 258 10*3/uL (ref 150–400)
RBC: 4.85 MIL/uL (ref 4.22–5.81)
RDW: 13.6 % (ref 11.5–15.5)
WBC: 8.8 10*3/uL (ref 4.0–10.5)

## 2017-09-24 NOTE — H&P (Signed)
Date: 09/24/2017               Patient Name:  Bradley [name] MRN: [id number]  DOB: 06-27-1961 Age / Sex: 56 y.o., male   PCP: Mack Hook, MD         Medical Service: Internal Medicine Teaching Service         Attending Physician: Dr. Aldine Contes, MD    First Contact: Dr. Johny Chess Pager: [phone]  Second Contact: Dr. Tiburcio Pea Pager: [phone]       After Hours (After 5p/  First Contact Pager: [phone]  weekends / holidays): Second Contact Pager: [phone]   Chief Complaint: left arm and chest pain  History of Present Illness:  Bradley [name] is a 56yo right-handed male with PMH significant for mild non-obstructive CAD, HTN and tobacco use who presents with left arm pain and chest pain for the last week. He reports intermittent substernal chest pain that radiates to his neck and lasts approximately 3-4 minutes before resolving on its own. He states the pain feels "heavy". Exacerbated by taking deep breaths. Denies noticing a pattern of his chest pain related to food or exertion. Endorses some SOB and nausea. Denies diaphoresis or vomiting. Takes 325mg  aspirin at home.  He also has "shooting" pain down his left arm and numbness in his index finger. This pain lasts for approximately 20-30 minutes before resolving. He does endorse some weakness in this arm as well. Exacerbated by movement. Denies pain in his other extremities or in his neck. Has had some difficulty at work due to the arm pain (he works as a Physiological scientist). He has never had the arm pain before but did have similar chest pain in 2016. However, he states this pain is more severe and lasts longer.  Prior cardiac work-up - [facility] 2016: Mild non-obstructive CAD, normal LV systolic function - Echo 8185: LVEF 65-70%, normal wall motion, mildly dilated LA, PA peak pressure 64mmHg - Patient denies any other work-up since 2016.  Also has noticed bright red blood in his stool for the last week. Prior to this week, he had not  noticed dark tarry stools or bloody stools. He states is normally constipated. Denies abdominal pain or unintentional weight loss. Denies use of NSAIDs. Patient has never had an EGD or colonoscopy. Has had an episode of painless bright red blood per rectum in the past, however this self-resolved and he did not get any work-up at that time.  Denies fevers, chills, leg swelling, lightheadedness, dysuria, or hematuria. Smokes ~1/2ppd. Drinks a beer "every once in a while". Uses marijuana. Last used 4 days ago.  ED Course: - BP 150/102, HR 70, temp 97.6, RR 16, O2 100% on RA - CBC, BMP, and troponin normal - EKG NSR. CXR with no acute cardiopulmonary disease  Meds:  Current Meds  Medication Sig  . aspirin EC 325 MG tablet Take 325 mg daily by mouth.  . hydrocortisone cream 1 % Apply 1 application 2 (two) times daily topically. For eczema   Allergies: Allergies as of 09/24/2017  . (No Known Allergies)   Past Medical History:  Diagnosis Date  . Arthritis    "lower back by by butt; right shoulder" (12/10/2014)  . Chronic back pain   . Chronic sinusitis   . Depression 05/31/2017  . Eczema of both hands    Sometimes nickel allergy infraumbilical, flexural as well at times  . Hearing loss   . Hypertension 2015  . Kidney stones Teen  Lithotripsy he believes on the right  . Sciatica   . Seasonal allergies   . Tobacco abuse    Family History:  - brother with heart attack at age 68 - sister with heart attack in her 62s - mother with heart attack in her 58s  Family History  Problem Relation Age of Onset  . Coronary artery disease Father   . Hodgkin's lymphoma Father        Hodgkin's Lymphoma--cause of death  . Coronary artery disease Mother        Died of MI  . Diabetes Mother   . Hypertension Mother   . Hyperlipidemia Mother   . Coronary artery disease Sister 45       CABG  . Diabetes Sister   . Hypertension Sister   . Hyperlipidemia Sister   . Prostate cancer Brother 93  .  Seizures Son        Father describes absence seizures  . Diabetes Sister   . Hypertension Sister   . Hyperlipidemia Sister   . Coronary artery disease Brother        Died following complication following CABG  . Hypertension Brother    Social History:  - smokes ~1/2ppd.  - drinks a beer "every once in a while".  - Uses marijuana. Last used 4 days ago. - works as a Physiological scientist  Review of Systems: - Notes eczematous rash on bilateral hands that improves with hydrocortisone cream A complete ROS was negative except as per HPI.  Physical Exam: Blood pressure (!) 163/101, pulse 65, temperature 98.1 F (36.7 C), temperature source Oral, resp. rate 17, SpO2 99 %. GEN: Well-appearing, well-nourished male lying in bed in NAD. Alert and oriented. HENT: [place]/AT. Moist mucous membranes. No visible lesions. EYES: Sclera non-icteric. Conjunctiva clear. RESP: Clear to auscultation bilaterally. No wheezes, rales, or rhonchi. No increased work of breathing. CV: Normal rate and regular rhythm. No murmurs, gallops, or rubs. No carotid bruits. No LE edema. No tenderness to palpation of anterior chest wall. ABD: Soft. Non-tender. Non-distended. Normoactive bowel sounds. EXT: No edema. Warm. 2+ DP and radial pulses. MSK: Numbness in left index finger and left lateral thigh. Mild soreness to palpation diffusely on left upper arm. Normal strength. Normal ROM of left arm. NEURO: Cranial nerves II-XII grossly intact. Able to lift all four extremities against gravity. No apparent audiovisual hallucinations. Speech fluent and appropriate. PSYCH: Patient is calm and pleasant. Appropriate affect. Well-groomed; speech is appropriate and on-subject.  Labs CBC Latest Ref Rng & Units 09/24/2017 06/08/2017 12/09/2016  WBC 4.0 - 10.5 K/uL 8.8 5.8 6.3  Hemoglobin 13.0 - 17.0 g/dL 15.5 14.5 14.1  Hematocrit 39.0 - 52.0 % 45.5 43.4 42.2  Platelets 150 - 400 K/uL 258 270 267   BMP Latest Ref Rng & Units 09/24/2017 06/08/2017  12/09/2016  Glucose 65 - 99 mg/dL 94 92 94  BUN 6 - 20 mg/dL 14 16 17   Creatinine 0.61 - 1.24 mg/dL 0.81 0.81 1.01  BUN/Creat Ratio 9 - 20 - 20 -  Sodium 135 - 145 mmol/L 140 144 140  Potassium 3.5 - 5.1 mmol/L 4.1 4.3 3.9  Chloride 101 - 111 mmol/L 109 109(H) 109  CO2 22 - 32 mmol/L 26 22 25   Calcium 8.9 - 10.3 mg/dL 9.5 9.2 9.0   Troponin negative  EKG: NSR  CXR: No acute cardiopulmonary disease  Assessment & Plan by Problem: Active Problems:   Chest pain  Mr. Fredericksen is a 56yo right-handed male with PMH  significant for mild non-obstructive CAD, HTN and tobacco use who presents with left arm pain and chest pain for the last week.  Chest pain Prior cardiac work-up, including LHC, in 2016 showed mild non-obstructive CAD. He has chest pain with typical and atypical features. The pain is "heavy" in nature and radiates to his neck, but also transient and not worsened by exertion. He does describe left arm pain, however not necessarily related to the chest pain. Troponin negative x1. EKG with NSR. CXR normal. VSS. - trend troponins - aspirin 325mg  today, then aspirin 81mg  after - atorvastatin 40mg   Left arm pain Described as "shooting" and a "deep" pain with left index finger numbness. Patient denies neck pain. Possibly cervical radiculopathy or MSK pain? Per chart review, he has had complaints of bilateral arm pain with numbness and tingling since 2015. Cervical x-ray in 2013 showed degenerative disc and facet changes in the inferior cervical spine. - diclofenac gel 2g 4 times daily - Can consider cervical MRI outpatient for further work-up  Painless hematochezia Chronic issue. Seen by PCP in July with negative FOBT and placed GI referral. Hb stable at 15, FOBT pending. CT abdomen in Feb 2018 with colonic diverticulosis. Patient denies dizziness or lightheadedness. Orthostatics negative. BP and HR stable. Likely diverticular bleed vs hemorrhoids. - F/u FOBT  HTN Home regimen includes  amlodipine and metoprolol, however he states he ran out of one of these medications. BP here has been elevated, most recently 163/101. HR 65 - amlodipine 10mg  daily - metoprolol 25mg  BID  Eczema Chronic and stable. On bilateral hands. - Hydrocortisone cream 2 times daily  Diet: HH VTE PPx: Lovenox Code: Full Dispo: Admit patient to Observation with expected length of stay less than 2 midnights.  Signed: Colbert Ewing, MD 09/24/2017, 11:18 PM

## 2017-09-24 NOTE — ED Triage Notes (Signed)
Pt reporting he has noticed rectal bleeding when passing stools.  No h/o hemorrhoids.

## 2017-09-24 NOTE — ED Provider Notes (Signed)
[place] EMERGENCY DEPARTMENT Provider Note   CSN: [id number] Arrival date & time: 09/24/17  1246     History   Chief Complaint Chief Complaint  Patient presents with  . Chest Pain   HPI  Bradley [name] is a 56 year old male presenting with chest pain times 1 week.  He did not previously seek medical care.  Chest pain came on suddenly, it comes and goes, and lasts about 1-2 minutes.  He describes some shortness of breath with standing up.  Pain is worse with activity and better with rest.  9/10 in severity, sharp and stabbing in nature.  Pain travels into his left arm  and this prevents him from sleeping at night.  He did not take any pain medication for the pain at home, but has taken 325 mg of aspirin daily.  No fevers, chills, dizziness.  He endorses nausea but has not vomited.  Denies abdominal pain or leg swelling.  No history of recent travel by flight or long car rides.  No history of cancer or DVT/PE.  He has a family history of heart disease; his older brother passed away from a massive heart attack at age 72.  He is a daily smoker.   Additionally, he has noticed blood in his stool this past week.  This is not a new finding for him and he follows with his primary care provider for this.  PCP was unable to identify blood on FOBT outpatient.  He states he is currently having that bleed and is scheduled for a colonoscopy.  Past Medical History:  Diagnosis Date  . Arthritis    "lower back by by butt; right shoulder" (12/10/2014)  . Chronic back pain   . Chronic sinusitis   . Depression 05/31/2017  . Eczema of both hands    Sometimes nickel allergy infraumbilical, flexural as well at times  . Hearing loss   . Hypertension 2015  . Kidney stones Teen   Lithotripsy he believes on the right  . Sciatica   . Seasonal allergies   . Tobacco abuse    Patient Active Problem List   Diagnosis Date Noted  . Depression 05/31/2017  . Kidney stones   . Eczema of both  hands   . Essential hypertension 09/06/2016  . Eczema 09/06/2016  . Pain in the chest   . Hypertensive urgency 12/10/2014  . Dizziness 12/10/2014  . Chest pain 12/10/2014  . BENIGN NEOPLASM OTHER&UNSPECIFIED PARTS MOUTH 11/11/2009  . ABSCESS, AXILLA, RIGHT 11/11/2009  . DERMATITIS, ALLERGIC 11/11/2009   Past Surgical History:  Procedure Laterality Date  . INCISION AND DRAINAGE ABSCESS     "boils; under right arm, hip, side"  . KIDNEY STONE SURGERY  ~ 1980   Lithotripsy to right, he believes  . LEFT HEART CATHETERIZATION WITH CORONARY ANGIOGRAM N/A 12/12/2014   Performed by Burnell Blanks, MD at [facility] CATH LAB    Home Medications    Prior to Admission medications   Medication Sig Start Date End Date Taking? Authorizing Provider  aspirin EC 325 MG tablet Take 325 mg daily by mouth.   Yes [provider]  hydrocortisone cream 1 % Apply 1 application 2 (two) times daily topically. For eczema   Yes [provider]  amLODipine (NORVASC) 10 MG tablet Take 1 tablet (10 mg total) by mouth daily. 01/19/17   Mack Hook, MD  aspirin 81 MG chewable tablet Chew 1 tablet (81 mg total) by mouth daily. Patient not taking: Reported on  12/09/2016 09/06/16   Mack Hook, MD  FLUoxetine (PROZAC) 10 MG tablet 1 tab daily by mouth for 7 days, then 2 tabs daily thereafter. Patient not taking: Reported on 09/24/2017 05/31/17   Mack Hook, MD  metoprolol tartrate (LOPRESSOR) 25 MG tablet Take 1 tablet (25 mg total) by mouth 2 (two) times daily. 01/19/17   Mack Hook, MD  nitroGLYCERIN (NITROSTAT) 0.4 MG SL tablet Place 1 tablet (0.4 mg total) under the tongue every 5 (five) minutes as needed for chest pain. Max of 3 doses Patient not taking: Reported on 09/24/2017 05/31/17   Mack Hook, MD  triamcinolone cream (KENALOG) 0.1 % Apply twice daily to affected areas Patient not taking: Reported on 09/24/2017 05/31/17   Mack Hook, MD   Family  History Family History  Problem Relation Age of Onset  . Coronary artery disease Father   . Hodgkin's lymphoma Father        Hodgkin's Lymphoma--cause of death  . Coronary artery disease Mother        Died of MI  . Diabetes Mother   . Hypertension Mother   . Hyperlipidemia Mother   . Coronary artery disease Sister 42       CABG  . Diabetes Sister   . Hypertension Sister   . Hyperlipidemia Sister   . Prostate cancer Brother 36  . Seizures Son        Father describes absence seizures  . Diabetes Sister   . Hypertension Sister   . Hyperlipidemia Sister   . Coronary artery disease Brother        Died following complication following CABG  . Hypertension Brother    Social History Social History   Tobacco Use  . Smoking status: Current Every Day Smoker    Packs/day: 0.10    Years: 44.00    Pack years: 4.40    Types: Cigarettes  . Smokeless tobacco: Never Used  . Tobacco comment: Has not tried anything but cold [place]  Substance Use Topics  . Alcohol use: Yes    Comment: beer occasional  . Drug use: Yes    Types: Marijuana    Comment: 12/10/2014 "couple times/wk" No MJ now 08/2016   Allergies   Patient has no known allergies.  Review of Systems Review of Systems  Constitutional: Negative for chills and fever.  Respiratory: Positive for shortness of breath.   Cardiovascular: Positive for chest pain and palpitations. Negative for leg swelling.  Gastrointestinal: Positive for blood in stool and nausea. Negative for constipation and vomiting.  Neurological: Negative for dizziness, speech difficulty, weakness and headaches.   Physical Exam Updated Vital Signs BP (!) 165/96   Pulse 70   Temp 98.1 F (36.7 C) (Oral)   Resp 11   SpO2 100%   Physical Exam [name], NAD  Skin -war, dry, no rash HEENT- NCAT, EOMI, MMM Neck - supple, no JVD Chest -clear to auscultation bilaterally, normal work of breathing Heart - RRR no MRG, chest pain not reproducible  with palpation of chest wall Abdomen - soft, NT ND, positive bowel sounds Musculoskeletal - no edema Neuro -alert, oriented x3, strength 5 out of 5 bilaterally in upper and lower extremities, normal sensation   ED Treatments / Results  Labs (all labs ordered are listed, but only abnormal results are displayed) Labs Reviewed  BASIC METABOLIC PANEL  CBC  I-STAT TROPONIN, ED  POC OCCULT BLOOD, ED   EKG  EKG Interpretation  Date/Time:  Sunday September 24 2017 12:51:33 EST Ventricular  Rate:  65 PR Interval:  146 QRS Duration: 80 QT Interval:  358 QTC Calculation: 372 R Axis:   51 Text Interpretation:  Normal sinus rhythm with sinus arrhythmia Normal ECG No significant change since last tracing Confirmed by Zenovia Jarred [phone]) on 09/24/2017 5:19:59 PM      Radiology Dg Chest 2 View  Result Date: 09/24/2017 CLINICAL DATA:  Chest pain for 1 week. EXAM: CHEST  2 VIEW COMPARISON:  04/20/2016 FINDINGS: EKG leads create artifact over the chest. Normal heart size and mediastinal contours. No acute infiltrate or edema. No effusion or pneumothorax. No acute osseous findings. IMPRESSION: Negative chest. Electronically Signed   By: Monte Fantasia M.D.   On: 09/24/2017 14:06   Procedures Procedures (including critical care time)  Medications Ordered in ED Medications - No data to display  Initial Impression / Assessment and Plan / ED Course  I have reviewed the triage vital signs and the nursing notes.  Pertinent labs & imaging results that were available during my care of the patient were reviewed by me and considered in my medical decision making (see chart for details).  55 year old male presenting with typical chest pain symptoms which began last week and are worse with exertion.  Vitals are within normal limits in ED.   EKG with normal sinus rhythm and no signs of ST elevation.  I-STAT troponin is within normal and labs reassuring.  Chest x-ray negative for active  cardiopulmonary disease.  CBC and BMP within normal range. Concern for ACS given typical chest pain worse with exertion and with radiation into arm.  Risk factors include tobacco use, hypertension and family history of cardiac disease.  Heart score of 4.  Low suspicion for PE given a low Wells score and unlikely MSK in nature and pain is not reproducible with palpation of chest wall.  Hemoglobin is stable at 15 and FOBT pending.   -May benefit from stress test and further cardiac workup -Discussed case with medicine resident and will admit to service.    Final Clinical Impressions(s) / ED Diagnoses   Final diagnoses:  None   ED Discharge Orders    None     Lovenia Kim, MD [place], PGY-2     Lovenia Kim, MD 09/24/17 2025    Macarthur Critchley, MD 09/24/17 2150

## 2017-09-24 NOTE — ED Notes (Signed)
Pt ambulated to bathroom with a steady gait

## 2017-09-24 NOTE — ED Notes (Signed)
Pt given sandwich and ginger ale per admitting doctor

## 2017-09-24 NOTE — ED Triage Notes (Signed)
Onset  1 week left arm pain- constant, sharp.  Onset last night mid chest pain, intermittant, lasts 10 minutes, every hour, decreases with rest.  Shortness of breath with chest pain.

## 2017-09-25 ENCOUNTER — Encounter (HOSPITAL_COMMUNITY): Payer: Self-pay | Admitting: *Deleted

## 2017-09-25 ENCOUNTER — Observation Stay (HOSPITAL_BASED_OUTPATIENT_CLINIC_OR_DEPARTMENT_OTHER): Payer: 59

## 2017-09-25 DIAGNOSIS — R079 Chest pain, unspecified: Principal | ICD-10-CM

## 2017-09-25 LAB — TROPONIN I: Troponin I: <0.03 ng/mL (ref <0.03)

## 2017-09-25 LAB — OCCULT BLOOD, POC DEVICE: Fecal Occult Bld: NEGATIVE

## 2017-09-25 LAB — RAPID URINE DRUG SCREEN, HOSP PERFORMED
Amphetamines: NOT DETECTED
BARBITURATES: NOT DETECTED
Benzodiazepines: NOT DETECTED
COCAINE: NOT DETECTED
Opiates: NOT DETECTED
Tetrahydrocannabinol: POSITIVE — AB

## 2017-09-25 LAB — MRSA PCR SCREENING: MRSA BY PCR: NEGATIVE

## 2017-09-25 LAB — ECHOCARDIOGRAM COMPLETE
Height: 67 in
WEIGHTICAEL: 2592 [oz_av]

## 2017-09-25 LAB — HIV ANTIBODY (ROUTINE TESTING W REFLEX): HIV SCREEN 4TH GENERATION: NONREACTIVE

## 2017-09-25 MED ORDER — ASPIRIN EC 81 MG PO TBEC
81.0000 mg | DELAYED_RELEASE_TABLET | Freq: Every day | ORAL | Status: DC
  Administered 2017-09-25: 81 mg via ORAL
  Filled 2017-09-25: qty 1

## 2017-09-25 MED ORDER — INFLUENZA VAC SPLIT QUAD 0.5 ML IM SUSY
0.5000 mL | PREFILLED_SYRINGE | INTRAMUSCULAR | Status: AC
  Administered 2017-09-25: 0.5 mL via INTRAMUSCULAR
  Filled 2017-09-25: qty 0.5

## 2017-09-25 MED ORDER — ATORVASTATIN CALCIUM 40 MG PO TABS
40.0000 mg | ORAL_TABLET | Freq: Every day | ORAL | Status: DC
  Administered 2017-09-25: 40 mg via ORAL
  Filled 2017-09-25: qty 1

## 2017-09-25 MED ORDER — DICLOFENAC SODIUM 1 % TD GEL
2.0000 g | Freq: Four times a day (QID) | TRANSDERMAL | Status: DC
  Administered 2017-09-25 (×4): 2 g via TOPICAL
  Filled 2017-09-25: qty 100

## 2017-09-25 MED ORDER — ASPIRIN EC 325 MG PO TBEC
325.0000 mg | DELAYED_RELEASE_TABLET | Freq: Once | ORAL | Status: AC
  Administered 2017-09-25: 325 mg via ORAL
  Filled 2017-09-25: qty 1

## 2017-09-25 MED ORDER — ENOXAPARIN SODIUM 40 MG/0.4ML ~~LOC~~ SOLN
40.0000 mg | SUBCUTANEOUS | Status: DC
  Administered 2017-09-25: 40 mg via SUBCUTANEOUS
  Filled 2017-09-25 (×2): qty 0.4

## 2017-09-25 MED ORDER — AMLODIPINE BESYLATE 10 MG PO TABS: 10.0000 mg | ORAL_TABLET | Freq: Every day | ORAL | Status: DC

## 2017-09-25 MED ORDER — METOPROLOL TARTRATE 25 MG PO TABS
25.0000 mg | ORAL_TABLET | Freq: Two times a day (BID) | ORAL | Status: DC
  Administered 2017-09-25 (×2): 25 mg via ORAL
  Filled 2017-09-25 (×2): qty 1

## 2017-09-25 MED ORDER — ATORVASTATIN CALCIUM 40 MG PO TABS: 40.0000 mg | ORAL_TABLET | Freq: Every day | ORAL | 0 refills | Status: DC

## 2017-09-25 MED ORDER — AMLODIPINE BESYLATE 10 MG PO TABS
10.0000 mg | ORAL_TABLET | Freq: Every day | ORAL | Status: DC
  Administered 2017-09-25: 10 mg via ORAL
  Filled 2017-09-25: qty 1

## 2017-09-25 MED ORDER — NICOTINE 14 MG/24HR TD PT24
14.0000 mg | MEDICATED_PATCH | Freq: Every day | TRANSDERMAL | Status: DC
  Administered 2017-09-25: 14 mg via TRANSDERMAL
  Filled 2017-09-25: qty 1

## 2017-09-25 MED ORDER — HYDROCORTISONE 1 % EX CREA
1.0000 "application " | TOPICAL_CREAM | Freq: Two times a day (BID) | CUTANEOUS | Status: DC
  Administered 2017-09-25 (×2): 1 via TOPICAL
  Filled 2017-09-25: qty 28

## 2017-09-25 NOTE — Progress Notes (Signed)
   Subjective: No acute events overnight, patient states his chest pain has resolved, but he continues to experience intermittent shooting pain in his left arm, seems to originate around elbow.   Objective:  Vital signs in last 24 hours: Vitals:   09/25/17 0945 09/25/17 0948 09/25/17 1015 09/25/17 1100  BP: (!) 149/98 (!) 148/98 (!) 144/105 (!) 150/96  Pulse: (!) 55 (!) 58 (!) 59 (!) 56  Resp: 13  18 16   Temp:    97.9 F (36.6 C)  TempSrc:    Oral  SpO2: 100%  99% 100%  Weight:    162 lb (73.5 kg)  Height:    5\' 7"  (1.702 m)   General: Resting in bed comfortably, no acute distress CV: Regular rate and rhythm, s1, s2, no murmur appreciated  Resp: Clear breath sounds bilaterally, normal work of breathing, no distress  Abd: Soft, +BS, non-tender  Extr: No LE edema Neuro: Alert and oriented x3, Full strength in bilateral upper extremities, no sensory deficit in LUE or hand  Skin: Warm, dry      Assessment/Plan:  Chest Pain Patient presented with nonexertional pressure-like chest pain with no EKG changes.  He had a cath and echo performed in 2016 which showed mild nonobstructive CAD, EF 65-70%.  Troponin was trended and was negative x3. Overall course and clinical picture make an acute cardiac cause of his pain unlikely. --ASA 81 mg, Atorvastatin 40 mg, Metoprolol 25 mg BID --TTE    L Arm Pain Patient complains of shooting pain of the left arm that seems to originate around his elbow/forearm.  On chart review, his PCP has been managing him for lateral epicondylitis which improved with splinting in the past.  On a prior cervical spine x-ray he also has degenerative changes which may cause impingement and radicular pain down his left arm.  Further workup and management can be completed as an outpatient. --Cont voltaren gel   Hematochezia Patient also reports an ongoing issue with blood in his stool and on toilet tissue.  PCP has placed GI referral.  During admission, his hemoglobin  is been stable at 15.5, without dizziness/lightheadedness or other hemodynamic instability.  Agree with outpatient workup that has been initiated by PCP.  Dispo: Anticipated discharge in approximately 0-1 day(s).   Tawny Asal, MD 09/25/2017, 11:16 AM Pager: [phone]

## 2017-09-25 NOTE — ED Notes (Signed)
Patient requesting medication to help sleep. Paged MD.

## 2017-09-25 NOTE — Progress Notes (Signed)
  Echocardiogram 2D Echocardiogram has been performed.  Bradley [name] 09/25/2017, 2:29 PM

## 2017-09-25 NOTE — ED Notes (Signed)
Pt requesting some graham crackers to eat. Ok per [employer] pt given graham crackers and apple juice

## 2017-09-27 NOTE — Discharge Summary (Signed)
Name: Bradley [name] MRN: [id number] DOB: 1961/07/20 56 y.o. PCP: Mack Hook, MD  Date of Admission: 09/24/2017  4:00 PM Date of Discharge: 09/25/2017 Attending Physician: Dr. Aldine Contes   Discharge Diagnosis: Active Problems:   Chest pain   Discharge Medications: Allergies as of 09/25/2017   No Known Allergies     Medication List    STOP taking these medications   FLUoxetine 10 MG tablet Commonly known as:  PROZAC     TAKE these medications   amLODipine 10 MG tablet Commonly known as:  NORVASC Take 1 tablet (10 mg total) by mouth daily.   aspirin 81 MG chewable tablet Chew 1 tablet (81 mg total) by mouth daily. What changed:  Another medication with the same name was removed. Continue taking this medication, and follow the directions you see here.   atorvastatin 40 MG tablet Commonly known as:  LIPITOR Take 1 tablet (40 mg total) daily at 6 PM by mouth.   hydrocortisone cream 1 % Apply 1 application 2 (two) times daily topically. For eczema   metoprolol tartrate 25 MG tablet Commonly known as:  LOPRESSOR Take 1 tablet (25 mg total) by mouth 2 (two) times daily.   nitroGLYCERIN 0.4 MG SL tablet Commonly known as:  NITROSTAT Place 1 tablet (0.4 mg total) under the tongue every 5 (five) minutes as needed for chest pain. Max of 3 doses   triamcinolone cream 0.1 % Commonly known as:  KENALOG Apply twice daily to affected areas       Disposition and follow-up:   Mr.Bradley [name] was discharged from [facility] in Good condition.  At the hospital follow up visit please address:  1.  -Ensure continued resolution of chest pain, adherence to preventative medications -Keep GI referral and further evaluation of hematochezia with colonoscopy  -Continue management of L arm pain   2.  Labs / imaging needed at time of follow-up: None  3.  Pending labs/ test needing follow-up: None   Follow-up Appointments:   Hospital Course  by problem list:  Chest Pain Patient presented with a one-week history of intermittent substernal chest pain that was nonexertional and brief, described as pressure.  He had a cath and echo performed in 2016 which showed mild nonobstructive CAD and an EF of 65%.  No ischemic changes were present on EKG and troponin was trended and remained negative x3.  Repeat echo showed EF 60-65% with no regional wall motion abnormalities or other findings.  Given his negative workup, a cardiac cause of his chest pain was felt to be unlikely. He has been referred to cardiology by his PCP. He was continued on aspirin 81, metoprolol 25 mg twice daily, amlodipine 10 mg during admission and on discharge, he was also started on atorvastatin 40 mg.   L Arm Pain Patient also complained of a chronic issue of left arm pain that is shooting in nature, seems to originate around his elbow/forearm and is associated with intermittent left index finger numbness.  On chart review, he was previously diagnosed with lateral epicondylitis which improved with splinting.  He also has a prior cervical spine x-ray that showed degenerative changes which may be causing a radicular type pain.  He was given Voltaren gel as an inpatient and further workup/management was deferred to outpatient follow-up.  He may benefit from reinstituting splinting which appears to have improved his pain in the past.  Hematochezia Patient has also had ongoing intermittent blood associated with bowel movements, primarily on  the toilet tissue.  A GI referral has previously been placed and he is awaiting further evaluation.  During admission, his hemoglobin was stable and at 15.5, no dizziness/lightheadedness or other hemodynamic instability.  Encouraged to continue his outpatient workup and follow-up with GI.  Discharge Vitals:   BP (!) 151/97 (BP Location: Left Arm)   Pulse 66   Temp 97.9 F (36.6 C) (Oral)   Resp 16   Ht 5\' 7"  (1.702 m)   Wt 162 lb (73.5 kg)    SpO2 100%   BMI 25.37 kg/m   Pertinent Labs, Studies, and Procedures:  CBC Latest Ref Rng & Units 09/24/2017 06/08/2017 12/09/2016  WBC 4.0 - 10.5 K/uL 8.8 5.8 6.3  Hemoglobin 13.0 - 17.0 g/dL 15.5 14.5 14.1  Hematocrit 39.0 - 52.0 % 45.5 43.4 42.2  Platelets 150 - 400 K/uL 258 270 267     Discharge Instructions: Discharge Instructions    Discharge instructions   Complete by:  As directed    It was a pleasure taking care of you Mr. Bradley [name].  -All your labs looking at your heart, the echo of your heart, and your EKG have looked great in the hospital.  -We prescribed a new medicine called atorvastatin (Lipitor) for your cholesterol to help protect your heart -Your arm pain may be an irritated tendon in your elbow or a result of arthritis in your neck. Be sure to follow up with your primary care doctor to see if any further tests need to be done  -Your primary care doctor can also help arrange for you to have a colonoscopy to investigate the blood in your stool that has been happening      Signed: Tawny Asal, MD 09/27/2017, 10:48 AM   Pager: [phone]

## 2017-10-23 ENCOUNTER — Ambulatory Visit: Payer: Self-pay | Admitting: Internal Medicine

## 2018-08-06 ENCOUNTER — Emergency Department (HOSPITAL_COMMUNITY): Payer: Self-pay

## 2018-08-06 ENCOUNTER — Encounter (HOSPITAL_COMMUNITY): Payer: Self-pay | Admitting: Emergency Medicine

## 2018-08-06 ENCOUNTER — Emergency Department (HOSPITAL_COMMUNITY)
Admission: EM | Admit: 2018-08-06 | Discharge: 2018-08-06 | Disposition: A | Discharge status: Home / Self Care | Payer: Self-pay | Attending: Emergency Medicine | Admitting: Emergency Medicine

## 2018-08-06 ENCOUNTER — Other Ambulatory Visit: Payer: Self-pay

## 2018-08-06 DIAGNOSIS — M7712 Lateral epicondylitis, left elbow: Secondary | ICD-10-CM | POA: Insufficient documentation

## 2018-08-06 DIAGNOSIS — K5792 Diverticulitis of intestine, part unspecified, without perforation or abscess without bleeding: Secondary | ICD-10-CM

## 2018-08-06 DIAGNOSIS — K5793 Diverticulitis of intestine, part unspecified, without perforation or abscess with bleeding: Secondary | ICD-10-CM | POA: Insufficient documentation

## 2018-08-06 DIAGNOSIS — Z79899 Other long term (current) drug therapy: Secondary | ICD-10-CM | POA: Insufficient documentation

## 2018-08-06 DIAGNOSIS — K922 Gastrointestinal hemorrhage, unspecified: Secondary | ICD-10-CM

## 2018-08-06 DIAGNOSIS — Z7982 Long term (current) use of aspirin: Secondary | ICD-10-CM | POA: Insufficient documentation

## 2018-08-06 DIAGNOSIS — I10 Essential (primary) hypertension: Secondary | ICD-10-CM | POA: Insufficient documentation

## 2018-08-06 DIAGNOSIS — F17210 Nicotine dependence, cigarettes, uncomplicated: Secondary | ICD-10-CM | POA: Insufficient documentation

## 2018-08-06 LAB — COMPREHENSIVE METABOLIC PANEL
ALK PHOS: 63 U/L (ref 38–126)
ALT: 24 U/L (ref 0–44)
AST: 20 U/L (ref 15–41)
Albumin: 4.1 g/dL (ref 3.5–5.0)
Anion gap: 6 (ref 5–15)
BUN: 13 mg/dL (ref 6–20)
CALCIUM: 9.3 mg/dL (ref 8.9–10.3)
CO2: 24 mmol/L (ref 22–32)
CREATININE: 0.77 mg/dL (ref 0.61–1.24)
Chloride: 111 mmol/L (ref 98–111)
Glucose, Bld: 99 mg/dL (ref 70–99)
Potassium: 4.2 mmol/L (ref 3.5–5.1)
Sodium: 141 mmol/L (ref 135–145)
Total Bilirubin: 0.9 mg/dL (ref 0.3–1.2)
Total Protein: 7.5 g/dL (ref 6.5–8.1)

## 2018-08-06 LAB — CBC
HEMATOCRIT: 45.5 % (ref 39.0–52.0)
Hemoglobin: 14.8 g/dL (ref 13.0–17.0)
MCH: 30.8 pg (ref 26.0–34.0)
MCHC: 32.5 g/dL (ref 30.0–36.0)
MCV: 94.6 fL (ref 78.0–100.0)
PLATELETS: 198 10*3/uL (ref 150–400)
RBC: 4.81 MIL/uL (ref 4.22–5.81)
RDW: 13.3 % (ref 11.5–15.5)
WBC: 5 10*3/uL (ref 4.0–10.5)

## 2018-08-06 LAB — TYPE AND SCREEN
ABO/RH(D): O POS
Antibody Screen: NEGATIVE

## 2018-08-06 LAB — POC OCCULT BLOOD, ED: FECAL OCCULT BLD: POSITIVE — AB

## 2018-08-06 LAB — ABO/RH: ABO/RH(D): O POS

## 2018-08-06 MED ORDER — IOHEXOL 300 MG/ML  SOLN
100.0000 mL | Freq: Once | INTRAMUSCULAR | Status: AC | PRN
  Administered 2018-08-06: 100 mL via INTRAVENOUS

## 2018-08-06 MED ORDER — AMOXICILLIN-POT CLAVULANATE 875-125 MG PO TABS: 1.0000 | ORAL_TABLET | Freq: Two times a day (BID) | ORAL | 0 refills | Status: AC

## 2018-08-06 NOTE — ED Provider Notes (Signed)
[facility] EMERGENCY DEPARTMENT Provider Note   CSN: [id number] Arrival date & time: 08/06/18  1003     History   Chief Complaint Chief Complaint  Patient presents with  . GI Bleeding  . Arm Pain    HPI Bradley [name] is a 57 y.o. male.  57 year old male presents with complaint of dark red blood from his rectum times at least 2 months.  Patient states any time he does the bathroom the commode is full of dark red blood with clots.  Reports lower abdominal aching pain, intermittent.  Denies fevers, chills, nausea, vomiting, unintentional weight loss.  Patient is a smoker.  Patient is never had a colonoscopy, states that his brother died from colon cancer.  Also here for pain in his left elbow, no history of injury, states is been ongoing for the past 2 months, worse when he is at work, described as cramping in nature, denies associated chest pain or shortness of breath.  No other complaints or concerns.     Past Medical History:  Diagnosis Date  . Arthritis    "lower back by by butt; right shoulder" (12/10/2014)  . Chronic back pain   . Chronic sinusitis   . Depression 05/31/2017  . Eczema of both hands    Sometimes nickel allergy infraumbilical, flexural as well at times  . Hearing loss   . Hypertension 2015  . Kidney stones Teen   Lithotripsy he believes on the right  . Sciatica   . Seasonal allergies   . Tobacco abuse     Patient Active Problem List   Diagnosis Date Noted  . Depression 05/31/2017  . Kidney stones   . Eczema of both hands   . Essential hypertension 09/06/2016  . Eczema 09/06/2016  . Pain in the chest   . Hypertensive urgency 12/10/2014  . Dizziness 12/10/2014  . Chest pain 12/10/2014  . BENIGN NEOPLASM OTHER&UNSPECIFIED PARTS MOUTH 11/11/2009  . ABSCESS, AXILLA, RIGHT 11/11/2009  . DERMATITIS, ALLERGIC 11/11/2009    Past Surgical History:  Procedure Laterality Date  . INCISION AND DRAINAGE ABSCESS     "boils; under right  arm, hip, side"  . KIDNEY STONE SURGERY  ~ 1980   Lithotripsy to right, he believes  . LEFT HEART CATHETERIZATION WITH CORONARY ANGIOGRAM N/A 12/12/2014   Procedure: LEFT HEART CATHETERIZATION WITH CORONARY ANGIOGRAM;  Surgeon: Burnell Blanks, MD;  Location: [facility] CATH LAB;  Service: Cardiovascular;  Laterality: N/A;        Home Medications    Prior to Admission medications   Medication Sig Start Date End Date Taking? Authorizing Provider  amLODipine (NORVASC) 10 MG tablet Take 1 tablet (10 mg total) by mouth daily. Patient not taking: Reported on 08/06/2018 01/19/17   Mack Hook, MD  amoxicillin-clavulanate (AUGMENTIN) 875-125 MG tablet Take 1 tablet by mouth every 12 (twelve) hours for 10 days. 08/06/18 08/16/18  Tacy Learn, PA-C  aspirin 81 MG chewable tablet Chew 1 tablet (81 mg total) by mouth daily. Patient not taking: Reported on 12/09/2016 09/06/16   Mack Hook, MD  atorvastatin (LIPITOR) 40 MG tablet Take 1 tablet (40 mg total) daily at 6 PM by mouth. Patient not taking: Reported on 08/06/2018 09/25/17   Burgess Estelle, MD  metoprolol tartrate (LOPRESSOR) 25 MG tablet Take 1 tablet (25 mg total) by mouth 2 (two) times daily. Patient not taking: Reported on 08/06/2018 01/19/17   Mack Hook, MD  nitroGLYCERIN (NITROSTAT) 0.4 MG SL tablet Place 1 tablet (0.4 mg  total) under the tongue every 5 (five) minutes as needed for chest pain. Max of 3 doses Patient not taking: Reported on 09/24/2017 05/31/17   Mack Hook, MD  triamcinolone cream (KENALOG) 0.1 % Apply twice daily to affected areas Patient not taking: Reported on 09/24/2017 05/31/17   Mack Hook, MD    Family History Family History  Problem Relation Age of Onset  . Coronary artery disease Father   . Hodgkin's lymphoma Father        Hodgkin's Lymphoma--cause of death  . Coronary artery disease Mother        Died of MI  . Diabetes Mother   . Hypertension Mother   .  Hyperlipidemia Mother   . Coronary artery disease Sister 2       CABG  . Diabetes Sister   . Hypertension Sister   . Hyperlipidemia Sister   . Prostate cancer Brother 14  . Seizures Son        Father describes absence seizures  . Diabetes Sister   . Hypertension Sister   . Hyperlipidemia Sister   . Coronary artery disease Brother        Died following complication following CABG  . Hypertension Brother     Social History Social History   Tobacco Use  . Smoking status: Current Every Day Smoker    Packs/day: 0.10    Years: 44.00    Pack years: 4.40    Types: Cigarettes  . Smokeless tobacco: Never Used  . Tobacco comment: Has not tried anything but cold [place]  Substance Use Topics  . Alcohol use: Yes    Comment: beer occasional  . Drug use: Yes    Types: Marijuana     Allergies   Patient has no known allergies.   Review of Systems Review of Systems  Constitutional: Negative for chills, diaphoresis, fatigue, fever and unexpected weight change.  Respiratory: Negative for shortness of breath.   Cardiovascular: Negative for chest pain.  Gastrointestinal: Positive for abdominal pain and blood in stool. Negative for abdominal distention, constipation, diarrhea, nausea and vomiting.  Musculoskeletal: Positive for myalgias. Negative for arthralgias, back pain and joint swelling.  Skin: Negative for color change, rash and wound.  Neurological: Negative for dizziness, weakness and light-headedness.  Hematological: Does not bruise/bleed easily.  Psychiatric/Behavioral: Negative for confusion.  All other systems reviewed and are negative.    Physical Exam Updated Vital Signs BP (!) 161/101   Pulse 61   Temp 98 F (36.7 C) (Oral)   Resp 17   Ht 5\' 6"  (1.676 m)   Wt 76.7 kg   SpO2 99%   BMI 27.28 kg/m   Physical Exam  Constitutional: He is oriented to person, place, and time. He appears well-developed and well-nourished. No distress.  HENT:  Head: Normocephalic  and atraumatic.  Cardiovascular: Normal rate, regular rhythm, normal heart sounds and intact distal pulses.  No murmur heard. Pulmonary/Chest: Effort normal and breath sounds normal. No respiratory distress.  Abdominal: Soft. Bowel sounds are normal. He exhibits no distension. There is tenderness in the right lower quadrant and left lower quadrant.  Musculoskeletal: Normal range of motion. He exhibits tenderness. He exhibits no deformity.       Left elbow: He exhibits normal range of motion, no swelling, no effusion, no deformity and no laceration. Tenderness found. Lateral epicondyle tenderness noted. No radial head, no medial epicondyle and no olecranon process tenderness noted.  Neurological: He is alert and oriented to person, place, and time. No sensory  deficit.  Skin: Skin is warm and dry. He is not diaphoretic. No pallor.  Psychiatric: He has a normal mood and affect. His behavior is normal.  Nursing note and vitals reviewed.    ED Treatments / Results  Labs (all labs ordered are listed, but only abnormal results are displayed) Labs Reviewed  COMPREHENSIVE METABOLIC PANEL  CBC  POC OCCULT BLOOD, ED  TYPE AND SCREEN  ABO/RH    EKG None  Radiology Ct Abdomen Pelvis W Contrast  Result Date: 08/06/2018 CLINICAL DATA:  Dark lower GI bleeding EXAM: CT ABDOMEN AND PELVIS WITH CONTRAST TECHNIQUE: Multidetector CT imaging of the abdomen and pelvis was performed using the standard protocol following bolus administration of intravenous contrast. CONTRAST:  144mL OMNIPAQUE IOHEXOL 300 MG/ML  SOLN COMPARISON:  12/09/2016 FINDINGS: Lower chest: Minor basilar atelectasis. Normal heart size. No pericardial pleural effusion. Hepatobiliary: No focal liver abnormality is seen. No gallstones, gallbladder wall thickening, or biliary dilatation. Pancreas: Unremarkable. No pancreatic ductal dilatation or surrounding inflammatory changes. Spleen: Normal in size without focal abnormality.  Adrenals/Urinary Tract: Stable subcentimeter nodularity of the left adrenal gland, suspect adenoma. Normal right adrenal gland. Kidneys demonstrate punctate nonobstructing right intrarenal calculi inferiorly. No acute obstruction, hydronephrosis, perinephric strandy edema, hydroureter, obstructing ureteral calculus, bladder abnormality. Stomach/Bowel: Negative for bowel obstruction significant dilatation, ileus, free air. Normal appendix demonstrated. Scattered colonic diverticulosis. Very minimal strandy edema/early inflammation about a diverticulum in the left mid descending colon, image 39 series 3. This is a subtle finding and may represent an early very mild acute focal diverticulitis. No associated perforation, fluid collection, phlegmon, or abscess. Vascular/Lymphatic: Aortic atherosclerosis. Negative for aneurysm or dissection. No occlusive process. Mesenteric and renal vasculature appear patent. Reproductive: Prostate gland mildly enlarged with calcifications. Symmetric seminal vesicles. Other: No abdominal wall hernia or abnormality. No abdominopelvic ascites. Musculoskeletal: Degenerative changes of the spine SI joints. Lower lumbar facet arthropathy. No acute osseous finding IMPRESSION: Findings suspicious for very mild early focal diverticulitis in the left mid descending colon, as above. Abdominal atherosclerosis without aneurysm Nonobstructing right nephrolithiasis Electronically Signed   By: Jerilynn Mages.  Shick M.D.   On: 08/06/2018 15:24    Procedures Procedures (including critical care time)  Medications Ordered in ED Medications  iohexol (OMNIPAQUE) 300 MG/ML solution 100 mL (100 mLs Intravenous Contrast Given 08/06/18 1458)     Initial Impression / Assessment and Plan / ED Course  I have reviewed the triage vital signs and the nursing notes.  Pertinent labs & imaging results that were available during my care of the patient were reviewed by me and considered in my medical decision making (see  chart for details).  Clinical Course as of Aug 06 1545  Mon Aug 06, 2652  [id number] 57 year old male with complaint of bloody stool x2 months.  Also reports lower abdominal discomfort.  Also here for pain in his left lateral elbow.  Patient has multiple medical problems, not currently taking any medication, he has a history of high blood pressure but does not take medication for this.  On exam he has tenderness across the lower abdominal area.  Patient provides a stool sample which has small amount of bright red blood in the cup with his stool.  Patient CBC shows normal hemoglobin hematocrit, CMP unremarkable.  CT suggests early diverticulitis.  No allergies, patient be treated with Augmentin, referral to GI for follow-up and colonoscopy.   [LM]    Clinical Course User Index [LM] Tacy Learn, PA-C    Final Clinical Impressions(s) /  ED Diagnoses   Final diagnoses:  Diverticulitis  Lower GI bleed  Lateral epicondylitis of left elbow    ED Discharge Orders         Ordered    amoxicillin-clavulanate (AUGMENTIN) 875-125 MG tablet  Every 12 hours     08/06/18 1543           Tacy Learn, PA-C 08/06/18 1546    Lennice Sites, DO 08/07/18 3762

## 2018-08-06 NOTE — ED Notes (Signed)
Patient verbalizes understanding of discharge instructions. Opportunity for questioning and answers were provided. Ambulatory at discharge in NAD.  

## 2018-08-06 NOTE — ED Triage Notes (Signed)
Pt reports when wiping he notices dark blood from rectum x1 month. Reports being seen here before for the same thing. Denis SOB or Chest pain. He also reports ongoing pain in left arm without radiation for months. Pt is A/O NAD

## 2018-08-06 NOTE — Discharge Instructions (Addendum)
Take Augmentin as prescribed and complete the full course. Follow-up with your primary care doctor for routine health maintenance and management of your blood pressure. Return to ER for worsening or concerning symptoms. Contact GI, referral given for follow-up as discussed. You can try alternating warm and cold compresses to your left elbow, also look at obtaining a tennis elbow brace.

## 2019-05-08 ENCOUNTER — Ambulatory Visit: Payer: Self-pay | Admitting: Internal Medicine

## 2019-08-01 ENCOUNTER — Emergency Department (HOSPITAL_COMMUNITY)
Admission: EM | Admit: 2019-08-01 | Discharge: 2019-08-01 | Discharge status: Against Medical Advice | Payer: Medicaid Other | Attending: Emergency Medicine | Admitting: Emergency Medicine

## 2019-08-01 ENCOUNTER — Encounter (HOSPITAL_COMMUNITY): Payer: Self-pay | Admitting: Emergency Medicine

## 2019-08-01 DIAGNOSIS — Z5321 Procedure and treatment not carried out due to patient leaving prior to being seen by health care provider: Secondary | ICD-10-CM | POA: Insufficient documentation

## 2019-08-01 LAB — COMPREHENSIVE METABOLIC PANEL
ALT: 21 U/L (ref 0–44)
AST: 20 U/L (ref 15–41)
Albumin: 3.8 g/dL (ref 3.5–5.0)
Alkaline Phosphatase: 87 U/L (ref 38–126)
Anion gap: 10 (ref 5–15)
BUN: 14 mg/dL (ref 6–20)
CO2: 23 mmol/L (ref 22–32)
Calcium: 9.1 mg/dL (ref 8.9–10.3)
Chloride: 105 mmol/L (ref 98–111)
Creatinine, Ser: 0.93 mg/dL (ref 0.61–1.24)
GFR calc Af Amer: >60 mL/min (ref >60)
GFR calc non Af Amer: >60 mL/min (ref >60)
Glucose, Bld: 109 mg/dL — ABNORMAL HIGH (ref 70–99)
Potassium: 3.9 mmol/L (ref 3.5–5.1)
Sodium: 138 mmol/L (ref 135–145)
Total Bilirubin: 0.6 mg/dL (ref 0.3–1.2)
Total Protein: 7.3 g/dL (ref 6.5–8.1)

## 2019-08-01 LAB — CBC
HCT: 45.3 % (ref 39.0–52.0)
Hemoglobin: 15.1 g/dL (ref 13.0–17.0)
MCH: 31.3 pg (ref 26.0–34.0)
MCHC: 33.3 g/dL (ref 30.0–36.0)
MCV: 94 fL (ref 80.0–100.0)
Platelets: 234 10*3/uL (ref 150–400)
RBC: 4.82 MIL/uL (ref 4.22–5.81)
RDW: 13.5 % (ref 11.5–15.5)
WBC: 10.7 10*3/uL — ABNORMAL HIGH (ref 4.0–10.5)
nRBC: 0 % (ref 0.0–0.2)

## 2019-08-01 LAB — LIPASE, BLOOD: Lipase: 25 U/L (ref 11–51)

## 2019-08-01 MED ORDER — SODIUM CHLORIDE 0.9% FLUSH: 3.0000 mL | Freq: Once | INTRAVENOUS | Status: DC

## 2019-08-01 NOTE — ED Notes (Signed)
Patient stated that he couldn't stay that he would come back tomorrow the wait was too long for him

## 2019-08-01 NOTE — ED Triage Notes (Signed)
Pt states for the past 2 days he has had LLQ pain with abd distention. Pt denies any n/v/d. Pt also having pain in his left elbow pain worse with working.

## 2021-05-28 ENCOUNTER — Encounter (HOSPITAL_COMMUNITY): Payer: Self-pay

## 2021-05-28 ENCOUNTER — Encounter (HOSPITAL_COMMUNITY): Payer: Self-pay | Admitting: Emergency Medicine

## 2021-05-28 ENCOUNTER — Ambulatory Visit (HOSPITAL_COMMUNITY)
Admission: EM | Admit: 2021-05-28 | Discharge: 2021-05-28 | Disposition: A | Discharge status: Home / Self Care | Payer: Self-pay | Attending: Urgent Care | Admitting: Urgent Care

## 2021-05-28 ENCOUNTER — Other Ambulatory Visit: Payer: Self-pay

## 2021-05-28 ENCOUNTER — Emergency Department (HOSPITAL_COMMUNITY)
Admission: EM | Admit: 2021-05-28 | Discharge: 2021-05-28 | Disposition: A | Discharge status: Home / Self Care | Payer: Medicaid Other | Attending: Emergency Medicine | Admitting: Emergency Medicine

## 2021-05-28 DIAGNOSIS — Z5321 Procedure and treatment not carried out due to patient leaving prior to being seen by health care provider: Secondary | ICD-10-CM | POA: Insufficient documentation

## 2021-05-28 DIAGNOSIS — R21 Rash and other nonspecific skin eruption: Secondary | ICD-10-CM

## 2021-05-28 DIAGNOSIS — I160 Hypertensive urgency: Secondary | ICD-10-CM

## 2021-05-28 DIAGNOSIS — I10 Essential (primary) hypertension: Secondary | ICD-10-CM

## 2021-05-28 DIAGNOSIS — M79602 Pain in left arm: Secondary | ICD-10-CM | POA: Insufficient documentation

## 2021-05-28 DIAGNOSIS — R0789 Other chest pain: Secondary | ICD-10-CM

## 2021-05-28 DIAGNOSIS — M79605 Pain in left leg: Secondary | ICD-10-CM | POA: Insufficient documentation

## 2021-05-28 DIAGNOSIS — M778 Other enthesopathies, not elsewhere classified: Secondary | ICD-10-CM

## 2021-05-28 DIAGNOSIS — L299 Pruritus, unspecified: Secondary | ICD-10-CM

## 2021-05-28 MED ORDER — AMLODIPINE BESYLATE 10 MG PO TABS: 10.0000 mg | ORAL_TABLET | Freq: Every day | ORAL | 0 refills | Status: DC

## 2021-05-28 MED ORDER — TRIAMCINOLONE ACETONIDE 0.1 % EX CREA: TOPICAL_CREAM | CUTANEOUS | 2 refills | Status: DC

## 2021-05-28 MED ORDER — ACETAMINOPHEN 325 MG PO TABS: 650.0000 mg | ORAL_TABLET | Freq: Four times a day (QID) | ORAL | 0 refills | Status: DC | PRN

## 2021-05-28 NOTE — ED Provider Notes (Signed)
[facility]   MRN: [id number] DOB: April 17, 1961  Subjective:   Bradley [name] is a 60 y.o. male presenting for 2-week history of persistent left arm itching, stinging pain over an area where he applied a particular cream.  He is also had persistent midsternal chest pain but denies that he is having active chest pain right now.  Denies diaphoresis, headache, confusion, dizziness, weakness on one side of body, shortness of breath, heart racing, nausea, vomiting, abdominal pain.  No history of MI or heart disease.  He does have high blood pressure but admits noncompliance.  He is also a smoker.  No current facility-administered medications for this encounter.  Current Outpatient Medications:    amLODipine (NORVASC) 10 MG tablet, Take 1 tablet (10 mg total) by mouth daily. (Patient not taking: Reported on 08/06/2018), Disp: 30 tablet, Rfl: 11   aspirin 81 MG chewable tablet, Chew 1 tablet (81 mg total) by mouth daily. (Patient not taking: Reported on 12/09/2016), Disp: 30 tablet, Rfl: 0   atorvastatin (LIPITOR) 40 MG tablet, Take 1 tablet (40 mg total) daily at 6 PM by mouth. (Patient not taking: Reported on 08/06/2018), Disp: 30 tablet, Rfl: 0   metoprolol tartrate (LOPRESSOR) 25 MG tablet, Take 1 tablet (25 mg total) by mouth 2 (two) times daily. (Patient not taking: Reported on 08/06/2018), Disp: 60 tablet, Rfl: 11   nitroGLYCERIN (NITROSTAT) 0.4 MG SL tablet, Place 1 tablet (0.4 mg total) under the tongue every 5 (five) minutes as needed for chest pain. Max of 3 doses (Patient not taking: Reported on 09/24/2017), Disp: 24 tablet, Rfl: 0   triamcinolone cream (KENALOG) 0.1 %, Apply twice daily to affected areas (Patient not taking: Reported on 09/24/2017), Disp: 80 g, Rfl: 2   No Known Allergies  Past Medical History:  Diagnosis Date   Arthritis    "lower back by by butt; right shoulder" (12/10/2014)   Chronic back pain    Chronic sinusitis    Depression 05/31/2017   Eczema of  both hands    Sometimes nickel allergy infraumbilical, flexural as well at times   Hearing loss    Hypertension 2015   Kidney stones Teen   Lithotripsy he believes on the right   Sciatica    Seasonal allergies    Tobacco abuse      Past Surgical History:  Procedure Laterality Date   INCISION AND DRAINAGE ABSCESS     "boils; under right arm, hip, side"   [place]  ~ 1980   Lithotripsy to right, he believes   LEFT HEART CATHETERIZATION WITH CORONARY ANGIOGRAM N/A 12/12/2014   Procedure: LEFT HEART CATHETERIZATION WITH CORONARY ANGIOGRAM;  Surgeon: Burnell Blanks, MD;  Location: [facility] CATH LAB;  Service: Cardiovascular;  Laterality: N/A;    Family History  Problem Relation Age of Onset   Coronary artery disease Father    Hodgkin's lymphoma Father        Hodgkin's Lymphoma--cause of death   Coronary artery disease Mother        Died of MI   Diabetes Mother    Hypertension Mother    Hyperlipidemia Mother    Coronary artery disease Sister 74       CABG   Diabetes Sister    Hypertension Sister    Hyperlipidemia Sister    Prostate cancer Brother 39   Seizures Son        Father describes absence seizures   Diabetes Sister    Hypertension Sister  Hyperlipidemia Sister    Coronary artery disease Brother        Died following complication following CABG   Hypertension Brother     Social History   Tobacco Use   Smoking status: Every Day    Packs/day: 0.10    Years: 44.00    Pack years: 4.40    Types: Cigarettes   Smokeless tobacco: Never   Tobacco comments:    Has not tried anything but cold [place]  Vaping Use   Vaping Use: Never used  Substance Use Topics   Alcohol use: Yes    Comment: beer occasional   Drug use: Yes    Types: Marijuana    ROS   Objective:   Vitals: BP (!) 176/111 (BP Location: Right Arm) Comment: provider notified  Pulse 60   Temp 97.8 F (36.6 C) (Oral)   Resp 18   SpO2 100%   Physical Exam Constitutional:       General: He is not in acute distress.    Appearance: Normal appearance. He is well-developed. He is not ill-appearing, toxic-appearing or diaphoretic.  HENT:     Head: Normocephalic and atraumatic.     Right Ear: External ear normal.     Left Ear: External ear normal.     Nose: Nose normal.     Mouth/Throat:     Mouth: Mucous membranes are moist.     Pharynx: Oropharynx is clear.  Eyes:     General: No scleral icterus.    Extraocular Movements: Extraocular movements intact.     Pupils: Pupils are equal, round, and reactive to light.  Cardiovascular:     Rate and Rhythm: Normal rate and regular rhythm.     Heart sounds: Normal heart sounds. No murmur heard.   No friction rub. No gallop.  Pulmonary:     Effort: Pulmonary effort is normal. No respiratory distress.     Breath sounds: Normal breath sounds. No stridor. No wheezing, rhonchi or rales.  Neurological:     Mental Status: He is alert and oriented to person, place, and time.     Cranial Nerves: No cranial nerve deficit.     Motor: No weakness.     Coordination: Coordination normal.     Gait: Gait normal.     Deep Tendon Reflexes: Reflexes normal.     Comments: Negative Romberg and pronator drift.  Psychiatric:        Mood and Affect: Mood normal.        Behavior: Behavior normal.        Thought Content: Thought content normal.    ED ECG REPORT   Date: 05/28/2021  Rate: 64bpm  Rhythm: normal sinus rhythm  QRS Axis: normal  Intervals: normal  ST/T Wave abnormalities: normal  Conduction Disutrbances:none  Narrative Interpretation: Sinus rhythm at 64 bpm, very comparable to previous EKGs.  Old EKG Reviewed: unchanged  I have personally reviewed the EKG tracing and agree with the computerized printout as noted.   Assessment and Plan :   PDMP not reviewed this encounter.  1. Rash and nonspecific skin eruption   2. Itching   3. Essential hypertension   4. Atypical chest pain   5. Hypertensive urgency   6. Left  elbow tendinitis     Patient's primary complaint was rash.  Suspect that this is a contact dermatitis from using diclofenac gel to address persistent left elbow pain.  Counseled that this is left elbow tendinitis as he does a lot of work as  a cook and has contributed to this inflammatory process.  Advised that he stop using diclofenac gel, use Tylenol.  He did also have midsternal chest pain and together with his risk factors of uncontrolled hypertension and smoking advised that he start being more medically compliant and practice a hypertensive friendly diet.  I refilled his amlodipine.  Recommended triamcinolone for his contact dermatitis.  Placed patient into PCP assistance program.  There are no signs of ACS, stroke on exam and he has a normal EKG therefore we will hold off on an ER visit.  Maintain strict ER precautions. Counseled patient on potential for adverse effects with medications prescribed today, patient verbalized understanding.    Jaynee Eagles, [place] 05/28/21 1358

## 2021-05-28 NOTE — ED Triage Notes (Signed)
Pt c/o sharp, throbbing pain in arm and legs on left side Started: two weeks ago Interventions: diclofenac (patient has rash in areas applied)

## 2021-05-28 NOTE — Discharge Instructions (Addendum)

## 2021-05-28 NOTE — ED Triage Notes (Signed)
Pt reports left arm pain and left leg pins and needle pain for a few weeks. Ambulatory to triage.

## 2021-06-11 ENCOUNTER — Encounter: Payer: Self-pay | Admitting: *Deleted

## 2022-01-11 ENCOUNTER — Encounter (HOSPITAL_COMMUNITY): Payer: Self-pay

## 2022-01-11 ENCOUNTER — Ambulatory Visit (HOSPITAL_COMMUNITY)
Admission: EM | Admit: 2022-01-11 | Discharge: 2022-01-11 | Disposition: A | Discharge status: Home / Self Care | Payer: Self-pay | Attending: Nurse Practitioner | Admitting: Nurse Practitioner

## 2022-01-11 ENCOUNTER — Other Ambulatory Visit: Payer: Self-pay

## 2022-01-11 ENCOUNTER — Ambulatory Visit (INDEPENDENT_AMBULATORY_CARE_PROVIDER_SITE_OTHER): Payer: PRIVATE HEALTH INSURANCE

## 2022-01-11 DIAGNOSIS — M79601 Pain in right arm: Secondary | ICD-10-CM

## 2022-01-11 DIAGNOSIS — M545 Low back pain, unspecified: Secondary | ICD-10-CM

## 2022-01-11 DIAGNOSIS — G8929 Other chronic pain: Secondary | ICD-10-CM

## 2022-01-11 DIAGNOSIS — M5442 Lumbago with sciatica, left side: Secondary | ICD-10-CM | POA: Diagnosis not present

## 2022-01-11 DIAGNOSIS — M5441 Lumbago with sciatica, right side: Secondary | ICD-10-CM

## 2022-01-11 DIAGNOSIS — I10 Essential (primary) hypertension: Secondary | ICD-10-CM | POA: Diagnosis present

## 2022-01-11 LAB — COMPREHENSIVE METABOLIC PANEL
ALT: 26 U/L (ref 0–44)
AST: 26 U/L (ref 15–41)
Albumin: 4.3 g/dL (ref 3.5–5.0)
Alkaline Phosphatase: 64 U/L (ref 38–126)
Anion gap: 7 (ref 5–15)
BUN: 15 mg/dL (ref 6–20)
CO2: 27 mmol/L (ref 22–32)
Calcium: 9.5 mg/dL (ref 8.9–10.3)
Chloride: 106 mmol/L (ref 98–111)
Creatinine, Ser: 0.89 mg/dL (ref 0.61–1.24)
GFR, Estimated: >60 mL/min (ref >60)
Glucose, Bld: 92 mg/dL (ref 70–99)
Potassium: 4.3 mmol/L (ref 3.5–5.1)
Sodium: 140 mmol/L (ref 135–145)
Total Bilirubin: 0.8 mg/dL (ref 0.3–1.2)
Total Protein: 7.8 g/dL (ref 6.5–8.1)

## 2022-01-11 LAB — CBC
HCT: 46.4 % (ref 39.0–52.0)
Hemoglobin: 15.9 g/dL (ref 13.0–17.0)
MCH: 32.5 pg (ref 26.0–34.0)
MCHC: 34.3 g/dL (ref 30.0–36.0)
MCV: 94.9 fL (ref 80.0–100.0)
Platelets: 227 10*3/uL (ref 150–400)
RBC: 4.89 MIL/uL (ref 4.22–5.81)
RDW: 13.5 % (ref 11.5–15.5)
WBC: 7.1 10*3/uL (ref 4.0–10.5)
nRBC: 0 % (ref 0.0–0.2)

## 2022-01-11 LAB — VITAMIN B12: Vitamin B-12: 654 pg/mL (ref 180–914)

## 2022-01-11 MED ORDER — AMLODIPINE BESYLATE 5 MG PO TABS: 5.0000 mg | ORAL_TABLET | Freq: Every day | ORAL | 0 refills | Status: DC

## 2022-01-11 MED ORDER — PREDNISONE 10 MG (21) PO TBPK: ORAL_TABLET | ORAL | 0 refills | Status: DC

## 2022-01-11 NOTE — ED Provider Notes (Signed)
[facility]    CSN: [id number] Arrival date & time: 01/11/22  8315      History   Chief Complaint Chief Complaint  Patient presents with   Shoulder Pain    HPI Bradley [name] is a 61 y.o. male.   Patient reports right neck and shoulder pain that is radiating to his fingertips.  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  He denies any fevers, nausea/vomiting, discoloration in his fingers, dropping of objects.    He is also having low back pain that radiates down both legs.  He reports the pain is worse after standing for long periods of time.  He denies fevers, numbness in between his legs, dysuria, and urinary frequency.   He also reports he has been out of blood pressure medication for a number of years.  He previously was on amlodipine and stopped taking it because he made significant dietary and lifestyle changes.      Past Medical History:  Diagnosis Date   Arthritis    "lower back by by butt; right shoulder" (12/10/2014)   Chronic back pain    Chronic sinusitis    Depression 05/31/2017   Eczema of both hands    Sometimes nickel allergy infraumbilical, flexural as well at times   Hearing loss    Hypertension 2015   Kidney stones Teen   Lithotripsy he believes on the right   Sciatica    Seasonal allergies    Tobacco abuse     Patient Active Problem List   Diagnosis Date Noted   Depression 05/31/2017   Kidney stones    Eczema of both hands    Essential hypertension 09/06/2016   Eczema 09/06/2016   Pain in the chest    Hypertensive urgency 12/10/2014   Dizziness 12/10/2014   Chest pain 12/10/2014   BENIGN NEOPLASM OTHER&UNSPECIFIED PARTS MOUTH 11/11/2009   ABSCESS, AXILLA, RIGHT 11/11/2009   DERMATITIS, ALLERGIC 11/11/2009    Past Surgical History:  Procedure Laterality Date   INCISION AND DRAINAGE ABSCESS     "boils; under right arm, hip, side"   [place]  ~ 1980   Lithotripsy to right, he  believes   LEFT HEART CATHETERIZATION WITH CORONARY ANGIOGRAM N/A 12/12/2014   Procedure: LEFT HEART CATHETERIZATION WITH CORONARY ANGIOGRAM;  Surgeon: Burnell Blanks, MD;  Location: [facility] CATH LAB;  Service: Cardiovascular;  Laterality: N/A;       Home Medications    Prior to Admission medications   Medication Sig Start Date End Date Taking? Authorizing Provider  predniSONE (STERAPRED UNI-PAK 21 TAB) 10 MG (21) TBPK tablet Take '40mg'$  on days 1-2. Take '30mg'$  on days 3-4. Take '20mg'$  on days 5-6. Take '10mg'$  on days 7-8. Take '5mg'$  on days 9-10, then stop. 01/11/22  Yes Eulogio Bear, NP  acetaminophen (TYLENOL) 325 MG tablet Take 2 tablets (650 mg total) by mouth every 6 (six) hours as needed. 05/28/21   Jaynee Eagles, PA-C  amLODipine (NORVASC) 5 MG tablet Take 1 tablet (5 mg total) by mouth daily. 01/11/22 02/10/22  Eulogio Bear, NP  aspirin 81 MG chewable tablet Chew 1 tablet (81 mg total) by mouth daily. Patient not taking: Reported on 12/09/2016 09/06/16   Mack Hook, MD  atorvastatin (LIPITOR) 40 MG tablet Take 1 tablet (40 mg total) daily at 6 PM by mouth. Patient not taking: Reported on 08/06/2018 09/25/17   Burgess Estelle, MD  metoprolol tartrate (LOPRESSOR) 25 MG  tablet Take 1 tablet (25 mg total) by mouth 2 (two) times daily. Patient not taking: Reported on 08/06/2018 01/19/17   Mack Hook, MD  nitroGLYCERIN (NITROSTAT) 0.4 MG SL tablet Place 1 tablet (0.4 mg total) under the tongue every 5 (five) minutes as needed for chest pain. Max of 3 doses Patient not taking: Reported on 09/24/2017 05/31/17   Mack Hook, MD  triamcinolone cream (KENALOG) 0.1 % Apply twice daily to affected area. 05/28/21   Jaynee Eagles, PA-C    Family History Family History  Problem Relation Age of Onset   Coronary artery disease Father    Hodgkin's lymphoma Father        Hodgkin's Lymphoma--cause of death   Coronary artery disease Mother        Died of MI   Diabetes Mother     Hypertension Mother    Hyperlipidemia Mother    Coronary artery disease Sister 11       CABG   Diabetes Sister    Hypertension Sister    Hyperlipidemia Sister    Prostate cancer Brother 67   Seizures Son        Father describes absence seizures   Diabetes Sister    Hypertension Sister    Hyperlipidemia Sister    Coronary artery disease Brother        Died following complication following CABG   Hypertension Brother     Social History Social History   Tobacco Use   Smoking status: Every Day    Packs/day: 0.10    Years: 44.00    Pack years: 4.40    Types: Cigarettes   Smokeless tobacco: Never   Tobacco comments:    Has not tried anything but cold [place]  Vaping Use   Vaping Use: Never used  Substance Use Topics   Alcohol use: Yes    Comment: beer occasional   Drug use: Yes    Types: Marijuana     Allergies   Patient has no known allergies.   Review of Systems Review of Systems  Constitutional: Negative.  Negative for activity change, appetite change, chills, fatigue and fever.  Eyes: Negative.   Respiratory: Negative.    Cardiovascular: Negative.   Genitourinary: Negative.   Musculoskeletal:  Positive for arthralgias, back pain and neck pain. Negative for gait problem, joint swelling, myalgias and neck stiffness.  Skin: Negative.  Negative for color change and rash.  Neurological:  Positive for numbness (fingertips and toes). Negative for dizziness, facial asymmetry, speech difficulty, weakness and headaches.  Psychiatric/Behavioral: Negative.  Negative for agitation, confusion and hallucinations. The patient is not nervous/anxious and is not hyperactive.   Per HPI  Physical Exam Triage Vital Signs ED Triage Vitals  Enc Vitals Group     BP 01/11/22 1050 (!) 184/109     Pulse Rate 01/11/22 1050 (!) 59     Resp 01/11/22 1050 18     Temp 01/11/22 1050 98.1 F (36.7 C)     Temp Source 01/11/22 1050 Oral     SpO2 01/11/22 1050 98 %     Weight --       Height --      Head Circumference --      Peak Flow --      Pain Score 01/11/22 1051 10     Pain Loc --      Pain Edu? --      Excl. in [place]? --    No data found.  Updated Vital Signs BP Marland Kitchen)  184/109 (BP Location: Right Arm)    Pulse (!) 59    Temp 98.1 F (36.7 C) (Oral)    Resp 18    SpO2 98%   Visual Acuity Right Eye Distance:   Left Eye Distance:   Bilateral Distance:    Right Eye Near:   Left Eye Near:    Bilateral Near:     Physical Exam Vitals and nursing note reviewed.  Constitutional:      General: He is not in acute distress.    Appearance: Normal appearance. He is not toxic-appearing.  HENT:     Head: Normocephalic and atraumatic.  Cardiovascular:     Rate and Rhythm: Normal rate and regular rhythm.  Pulmonary:     Effort: Pulmonary effort is normal. No respiratory distress.     Breath sounds: Normal breath sounds. No wheezing, rhonchi or rales.  Musculoskeletal:     Right lower leg: No edema.     Left lower leg: No edema.     Right ankle: Normal pulse.     Left ankle: Normal pulse.     Right foot: Normal capillary refill. Normal pulse.     Left foot: Normal capillary refill. Normal pulse.  Skin:    General: Skin is warm and dry.     Capillary Refill: Capillary refill takes less than 2 seconds.     Coloration: Skin is not jaundiced or pale.     Findings: No erythema.  Neurological:     Mental Status: He is alert and oriented to person, place, and time.     Sensory: No sensory deficit.     Motor: No weakness.     Coordination: Coordination normal.     Gait: Gait normal.  Psychiatric:        Mood and Affect: Mood normal.        Behavior: Behavior normal.        Thought Content: Thought content normal.        Judgment: Judgment normal.     UC Treatments / Results  Labs (all labs ordered are listed, but only abnormal results are displayed) Labs Reviewed  CBC  COMPREHENSIVE METABOLIC PANEL  VITAMIN Z60    EKG   Radiology DG Lumbar Spine  Complete  Result Date: 01/11/2022 CLINICAL DATA:  Chronic low back pain. Additional history provided: Right shoulder pain/numbness/tingling radiating down right arm to hand. Bilateral leg pain for over 2 months. EXAM: LUMBAR SPINE - COMPLETE 4+ VIEW COMPARISON:  CT abdomen/pelvis 08/06/2018. FINDINGS: Five lumbar vertebrae. The caudal most well-formed intervertebral disc space is designated L5-S1. No significant spondylolisthesis. No lumbar vertebral compression fracture. No more than mild disc space narrowing within the lumbar or visualized lower thoracic spine. Facet arthrosis, greatest at L4-L5 and L5-S1 Aortic atherosclerosis. IMPRESSION: No lumbar vertebral compression fracture Lower thoracic and lumbar spondylosis, as described. Aortic Atherosclerosis (ICD10-I70.0). Electronically Signed   By: Kellie Simmering D.O.   On: 01/11/2022 11:48    Procedures Procedures (including critical care time)  Medications Ordered in UC Medications - No data to display  Initial Impression / Assessment and Plan / UC Course  I have reviewed the triage vital signs and the nursing notes.  Pertinent labs & imaging results that were available during my care of the patient were reviewed by me and considered in my medical decision making (see chart for details).    Suspect both shoulder and back pain concerns are chronic and possibly related to arthritis.  Lumbar imaging of spine reveals spondylosis.  Check CBC to rule out anemia, Vitamin B12 to rule out Vitamin B12 deficiency, and CMET to check for hyperglycemia and kidney function with electrolytes and liver enzymes.  Suspect radiculopathy may be related to arthritis, start prednisone taper.   Blood pressure is elevated despite multiple rechecks and time in office today, encouraged resuming amlodipine and follow up with primary care provider, referral placed for patient into primary care assistance program.  If BP remains elevated above 140/90 despite amlodipine, seek  care for medication adjustment.  Final Clinical Impressions(s) / UC Diagnoses   Final diagnoses:  Right arm pain  Chronic midline low back pain with bilateral sciatica     Discharge Instructions      The Please resume amlodipine 5 mg daily for your blood pressure.  Take this every day until you are able to get in with a primary care provider.  Please also start the prednisone for the pain that you are having in your back and shoulder.      ED Prescriptions     Medication Sig Dispense Auth. Provider   amLODipine (NORVASC) 5 MG tablet Take 1 tablet (5 mg total) by mouth daily. 30 tablet Noemi Chapel A, NP   predniSONE (STERAPRED UNI-PAK 21 TAB) 10 MG (21) TBPK tablet Take '40mg'$  on days 1-2. Take '30mg'$  on days 3-4. Take '20mg'$  on days 5-6. Take '10mg'$  on days 7-8. Take '5mg'$  on days 9-10, then stop. 21 each Eulogio Bear, NP      PDMP not reviewed this encounter.   Eulogio Bear, NP 01/11/22 1204

## 2022-01-11 NOTE — ED Triage Notes (Signed)
Pt c/o rt shoulder pain/numbness/tingling radiating down rt arm to hand and bilateral leg pain for over 2 months. Taking OTC meds with no relief. Denies injury.  ?

## 2022-01-11 NOTE — Discharge Instructions (Addendum)
The chest x-ray shows arthritis in your spine.  The prednisone should help with your pain.  We will let you know with any abnormal results from the blood work.  ? ?Please resume amlodipine 5 mg daily for your blood pressure.  Take this every day until you are able to get in with a primary care provider.  ?

## 2022-01-24 ENCOUNTER — Encounter (HOSPITAL_COMMUNITY): Payer: Self-pay | Admitting: *Deleted

## 2022-01-24 ENCOUNTER — Ambulatory Visit (HOSPITAL_COMMUNITY)
Admission: EM | Admit: 2022-01-24 | Discharge: 2022-01-24 | Disposition: A | Discharge status: Home / Self Care | Payer: PRIVATE HEALTH INSURANCE | Attending: Family Medicine | Admitting: Family Medicine

## 2022-01-24 ENCOUNTER — Other Ambulatory Visit: Payer: Self-pay

## 2022-01-24 DIAGNOSIS — R0789 Other chest pain: Secondary | ICD-10-CM

## 2022-01-24 DIAGNOSIS — I10 Essential (primary) hypertension: Secondary | ICD-10-CM | POA: Diagnosis not present

## 2022-01-24 DIAGNOSIS — M79601 Pain in right arm: Secondary | ICD-10-CM

## 2022-01-24 DIAGNOSIS — M79602 Pain in left arm: Secondary | ICD-10-CM | POA: Diagnosis not present

## 2022-01-24 MED ORDER — TIZANIDINE HCL 4 MG PO TABS: 4.0000 mg | ORAL_TABLET | Freq: Three times a day (TID) | ORAL | 0 refills | Status: DC | PRN

## 2022-01-24 MED ORDER — AMLODIPINE BESYLATE 10 MG PO TABS: 10.0000 mg | ORAL_TABLET | Freq: Every day | ORAL | 1 refills | Status: DC

## 2022-01-24 NOTE — Discharge Instructions (Signed)
As we discussed, I believe that your pain is related to a muscle issue given you hurt when I push on your chest.  I do think it is reasonable to follow-up with a cardiologist given your history so please call to schedule an appointment.  We will try to find you a primary care as well as; somebody should call to schedule an appointment.  Use tizanidine up to 3 times a day for pain.  This can make you sleepy so do not drive or drink alcohol with taking it.  You can use Tylenol for additional pain as well as warm compresses.  Your blood pressure is elevated so we are increasing your amlodipine to 10 mg.  Avoid aspirin/ibuprofen/naproxen, decongestants, caffeine, sodium.  If you have any change in your chest pain or develop additional symptoms such as lightheadedness, sweaty, nausea, vomiting, weakness, shortness of breath you must go immediately to the emergency room as we discussed. ?

## 2022-01-24 NOTE — ED Provider Notes (Signed)
?[facility] ? ? ? ?CSN: [id number] ?Arrival date & time: 01/24/22  1015 ? ? ?  ? ?History   ?Chief Complaint ?Chief Complaint  ?Patient presents with  ? Muscle Pain  ? Arm Pain  ? ? ?HPI ?Bradley [name] is a 61 y.o. male.  ? ?Patient presents today with a multiple month history of intermittent right and left arm pain and numbness for which she has been evaluated in the past.  Over the past 3 days he has had continued symptoms and also developed anterior chest wall pain.  Chest wall pain is intermittent without identifiable trigger; not associated with activity.  He denies any shortness of breath, diaphoresis, nausea, vomiting, lightheadedness.  Reports pain is rated 8/9 on a 0-10 pain scale, described as aching, worse with certain movements or palpation, no alleviating factors identified.  He has not tried any over-the-counter medication for symptom management.  Denies any recent illnesses; reports he had cough/nasal congestion several weeks ago but this is since resolved and denies any current symptoms.  He does have a history of intermittent chest pain and was hospitalized in 2016 at which point he had heart catheterization without significant blockage.  He is not currently followed by cardiology or primary care but is interested in establishing with a specialist.  He does have a history of hypertension and is currently on amlodipine 5 mg which she has been taking as prescribed.  Denies history of diabetes or hyperlipidemia.  He does smoke.  He does have a family history of heart disease. ? ? ?Past Medical History:  ?Diagnosis Date  ? Arthritis   ? "lower back by by butt; right shoulder" (12/10/2014)  ? Chronic back pain   ? Chronic sinusitis   ? Depression 05/31/2017  ? Eczema of both hands   ? Sometimes nickel allergy infraumbilical, flexural as well at times  ? Hearing loss   ? Hypertension 2015  ? Kidney stones Teen  ? Lithotripsy he believes on the right  ? Sciatica   ? Seasonal allergies   ? Tobacco  abuse   ? ? ?Patient Active Problem List  ? Diagnosis Date Noted  ? Depression 05/31/2017  ? Kidney stones   ? Eczema of both hands   ? Essential hypertension 09/06/2016  ? Eczema 09/06/2016  ? Pain in the chest   ? Hypertensive urgency 12/10/2014  ? Dizziness 12/10/2014  ? Chest pain 12/10/2014  ? BENIGN NEOPLASM OTHER&UNSPECIFIED PARTS MOUTH 11/11/2009  ? ABSCESS, AXILLA, RIGHT 11/11/2009  ? DERMATITIS, ALLERGIC 11/11/2009  ? ? ?Past Surgical History:  ?Procedure Laterality Date  ? INCISION AND DRAINAGE ABSCESS    ? "boils; under right arm, hip, side"  ? KIDNEY STONE SURGERY  ~ 1980  ? Lithotripsy to right, he believes  ? LEFT HEART CATHETERIZATION WITH CORONARY ANGIOGRAM N/A 12/12/2014  ? Procedure: LEFT HEART CATHETERIZATION WITH CORONARY ANGIOGRAM;  Surgeon: Burnell Blanks, MD;  Location: [facility] CATH LAB;  Service: Cardiovascular;  Laterality: N/A;  ? ? ? ? ? ?Home Medications   ? ?Prior to Admission medications   ?Medication Sig Start Date End Date Taking? Authorizing Provider  ?tiZANidine (ZANAFLEX) 4 MG tablet Take 1 tablet (4 mg total) by mouth every 8 (eight) hours as needed for muscle spasms. 01/24/22  Yes Moet Mikulski, Derry Skill, PA-C  ?acetaminophen (TYLENOL) 325 MG tablet Take 2 tablets (650 mg total) by mouth every 6 (six) hours as needed. 05/28/21   Jaynee Eagles, PA-C  ?amLODipine (NORVASC) 10 MG tablet Take  1 tablet (10 mg total) by mouth daily. 01/24/22 02/23/22  Dalbert Stillings, Derry Skill, PA-C  ?aspirin 81 MG chewable tablet Chew 1 tablet (81 mg total) by mouth daily. ?Patient not taking: Reported on 12/09/2016 09/06/16   Mack Hook, MD  ?atorvastatin (LIPITOR) 40 MG tablet Take 1 tablet (40 mg total) daily at 6 PM by mouth. ?Patient not taking: Reported on 08/06/2018 09/25/17   Burgess Estelle, MD  ?metoprolol tartrate (LOPRESSOR) 25 MG tablet Take 1 tablet (25 mg total) by mouth 2 (two) times daily. ?Patient not taking: Reported on 08/06/2018 01/19/17   Mack Hook, MD  ?nitroGLYCERIN (NITROSTAT) 0.4 MG SL  tablet Place 1 tablet (0.4 mg total) under the tongue every 5 (five) minutes as needed for chest pain. Max of 3 doses ?Patient not taking: Reported on 09/24/2017 05/31/17   Mack Hook, MD  ?predniSONE (STERAPRED UNI-PAK 21 TAB) 10 MG (21) TBPK tablet Take '40mg'$  on days 1-2. Take '30mg'$  on days 3-4. Take '20mg'$  on days 5-6. Take '10mg'$  on days 7-8. Take '5mg'$  on days 9-10, then stop. 01/11/22   Eulogio Bear, NP  ?triamcinolone cream (KENALOG) 0.1 % Apply twice daily to affected area. 05/28/21   Jaynee Eagles, PA-C  ? ? ?Family History ?Family History  ?Problem Relation Age of Onset  ? Coronary artery disease Father   ? Hodgkin's lymphoma Father   ?     Hodgkin's Lymphoma--cause of death  ? Coronary artery disease Mother   ?     Died of MI  ? Diabetes Mother   ? Hypertension Mother   ? Hyperlipidemia Mother   ? Coronary artery disease Sister 87  ?     CABG  ? Diabetes Sister   ? Hypertension Sister   ? Hyperlipidemia Sister   ? Prostate cancer Brother 61  ? Seizures Son   ?     Father describes absence seizures  ? Diabetes Sister   ? Hypertension Sister   ? Hyperlipidemia Sister   ? Coronary artery disease Brother   ?     Died following complication following CABG  ? Hypertension Brother   ? ? ?Social History ?Social History  ? ?Tobacco Use  ? Smoking status: Every Day  ?  Packs/day: 0.10  ?  Years: 44.00  ?  Pack years: 4.40  ?  Types: Cigarettes  ? Smokeless tobacco: Never  ? Tobacco comments:  ?  Has not tried anything but cold [place]  ?Vaping Use  ? Vaping Use: Never used  ?Substance Use Topics  ? Alcohol use: Yes  ?  Comment: beer occasional  ? Drug use: Yes  ?  Types: Marijuana  ? ? ? ?Allergies   ?Patient has no known allergies. ? ? ?Review of Systems ?Review of Systems  ?Constitutional:  Negative for activity change, appetite change, fatigue and fever.  ?HENT:  Negative for congestion, sinus pressure, sneezing and sore throat.   ?Respiratory:  Negative for cough and shortness of breath.   ?Cardiovascular:   Positive for chest pain.  ?Gastrointestinal:  Negative for abdominal pain, diarrhea, nausea and vomiting.  ?Neurological:  Negative for dizziness, light-headedness and headaches.  ? ? ?Physical Exam ?Triage Vital Signs ?ED Triage Vitals  ?Enc Vitals Group  ?   BP 01/24/22 1029 (!) 151/118  ?   Pulse Rate 01/24/22 1029 66  ?   Resp 01/24/22 1029 18  ?   Temp 01/24/22 1029 98.2 ?F (36.8 ?C)  ?   Temp src --   ?  SpO2 01/24/22 1029 99 %  ?   Weight --   ?   Height --   ?   Head Circumference --   ?   Peak Flow --   ?   Pain Score 01/24/22 1031 9  ?   Pain Loc --   ?   Pain Edu? --   ?   Excl. in [place]? --   ? ?No data found. ? ?Updated Vital Signs ?BP (!) 151/118   Pulse 66   Temp 98.2 ?F (36.8 ?C)   Resp 18   SpO2 99%  ? ?Visual Acuity ?Right Eye Distance:   ?Left Eye Distance:   ?Bilateral Distance:   ? ?Right Eye Near:   ?Left Eye Near:    ?Bilateral Near:    ? ?Physical Exam ?Vitals reviewed.  ?Constitutional:   ?   General: He is awake.  ?   Appearance: Normal appearance. He is well-developed. He is not ill-appearing.  ?   Comments: Very pleasant male appears stated age in no acute distress sitting comfortably in exam room  ?HENT:  ?   Head: Normocephalic and atraumatic.  ?   Mouth/Throat:  ?   Pharynx: No oropharyngeal exudate, posterior oropharyngeal erythema or uvula swelling.  ?Cardiovascular:  ?   Rate and Rhythm: Normal rate and regular rhythm.  ?   Heart sounds: Normal heart sounds, S1 normal and S2 normal. No murmur heard. ?Pulmonary:  ?   Effort: Pulmonary effort is normal.  ?   Breath sounds: Normal breath sounds. No stridor. No wheezing, rhonchi or rales.  ?   Comments: Clear to auscultation bilaterally ?Chest:  ?   Chest wall: Tenderness present. No deformity or swelling.  ?   Comments: Pain is reproducible on exam with significant tenderness palpation over anterior chest wall. ?Abdominal:  ?   General: Bowel sounds are normal.  ?   Palpations: Abdomen is soft.  ?   Tenderness: There is no abdominal  tenderness.  ?   Comments: Benign abdominal exam.  ?Musculoskeletal:  ?   Right lower leg: No edema.  ?   Left lower leg: No edema.  ?   Comments: Normal pincer grip strength of bilateral hands.  Hands neur

## 2022-01-24 NOTE — ED Triage Notes (Signed)
Pt reports for past 3 days he has had muscle pain in his chest. Pain is worse when he breaths in or turns to his side. Pt reports lt arm pain ans his Rt arm   ?Feels numb. ?

## 2022-02-07 ENCOUNTER — Other Ambulatory Visit: Payer: Self-pay | Admitting: Nurse Practitioner

## 2022-09-01 ENCOUNTER — Ambulatory Visit (HOSPITAL_COMMUNITY)
Admission: EM | Admit: 2022-09-01 | Discharge: 2022-09-01 | Disposition: A | Discharge status: Home / Self Care | Payer: Commercial Managed Care - HMO | Attending: Emergency Medicine | Admitting: Emergency Medicine

## 2022-09-01 ENCOUNTER — Encounter (HOSPITAL_COMMUNITY): Payer: Self-pay

## 2022-09-01 DIAGNOSIS — M62830 Muscle spasm of back: Secondary | ICD-10-CM | POA: Diagnosis not present

## 2022-09-01 DIAGNOSIS — M5432 Sciatica, left side: Secondary | ICD-10-CM

## 2022-09-01 MED ORDER — TIZANIDINE HCL 4 MG PO TABS: 4.0000 mg | ORAL_TABLET | Freq: Three times a day (TID) | ORAL | 0 refills | Status: DC | PRN

## 2022-09-01 MED ORDER — DEXAMETHASONE SODIUM PHOSPHATE 10 MG/ML IJ SOLN
10.0000 mg | Freq: Once | INTRAMUSCULAR | Status: AC
  Administered 2022-09-01: 10 mg via INTRAMUSCULAR

## 2022-09-01 MED ORDER — DEXAMETHASONE SODIUM PHOSPHATE 10 MG/ML IJ SOLN
INTRAMUSCULAR | Status: AC
  Filled 2022-09-01: qty 1

## 2022-09-01 MED ORDER — AMLODIPINE BESYLATE 10 MG PO TABS: 10.0000 mg | ORAL_TABLET | Freq: Every day | ORAL | 0 refills | Status: DC

## 2022-09-01 MED ORDER — PREDNISONE 10 MG PO TABS: 20.0000 mg | ORAL_TABLET | Freq: Every day | ORAL | 0 refills | Status: DC

## 2022-09-01 NOTE — ED Triage Notes (Signed)
Pt c/o intermitted lower back pain radiating down rt leg with pain and numbness. C/o pain radiating up to rt shoulder also. Denies taking any meds for pain.  States has been out of his b/p meds x1wk.

## 2022-09-01 NOTE — Discharge Instructions (Addendum)
Prednisone has been sent to the pharmacy, you will take this medication in the morning for the next 5 days.  I advised that you take it in the morning as it can cause you to have difficulty sleeping if taken too late in the day.   Zanaflex as a muscle relaxant sent to the pharmacy, take this every 8 hours as needed for the muscle spasms.  Please be mindful that this medication can make you sleepy, I advised that you do not drive or operate heavy machinery after taking this medication.   I have attached information for [facility], please follow-up with them if the symptoms are not improving.   A PCP appointment has been scheduled for you

## 2022-09-01 NOTE — ED Provider Notes (Signed)
[facility]    CSN: [id number] Arrival date & time: 09/01/22  1127      History   Chief Complaint Chief Complaint  Patient presents with   Back Pain    HPI Bradley [name] is a 61 y.o. male.  Patient presents due to right lower back pain x 1 month.  Patient denies fall or trauma.  Patient reports pain is radiating down right leg. Patient denies urinary or bowel incontinence.  Patient endorses increased pain with standing.  Patient reports a previous episode of similar symptoms that improved with muscle relaxants.  Patient has not taken any medications for these symptoms.    Back Pain Associated symptoms: no dysuria and no fever     Past Medical History:  Diagnosis Date   Arthritis    "lower back by by butt; right shoulder" (12/10/2014)   Chronic back pain    Chronic sinusitis    Depression 05/31/2017   Eczema of both hands    Sometimes nickel allergy infraumbilical, flexural as well at times   Hearing loss    Hypertension 2015   Kidney stones Teen   Lithotripsy he believes on the right   Sciatica    Seasonal allergies    Tobacco abuse     Patient Active Problem List   Diagnosis Date Noted   Depression 05/31/2017   Kidney stones    Eczema of both hands    Essential hypertension 09/06/2016   Eczema 09/06/2016   Pain in the chest    Hypertensive urgency 12/10/2014   Dizziness 12/10/2014   Chest pain 12/10/2014   BENIGN NEOPLASM OTHER&UNSPECIFIED PARTS MOUTH 11/11/2009   ABSCESS, AXILLA, RIGHT 11/11/2009   DERMATITIS, ALLERGIC 11/11/2009    Past Surgical History:  Procedure Laterality Date   INCISION AND DRAINAGE ABSCESS     "boils; under right arm, hip, side"   [place]  ~ 1980   Lithotripsy to right, he believes   LEFT HEART CATHETERIZATION WITH CORONARY ANGIOGRAM N/A 12/12/2014   Procedure: LEFT HEART CATHETERIZATION WITH CORONARY ANGIOGRAM;  Surgeon: Burnell Blanks, MD;  Location: [facility] CATH LAB;  Service: Cardiovascular;   Laterality: N/A;       Home Medications    Prior to Admission medications   Medication Sig Start Date End Date Taking? Authorizing Provider  predniSONE (DELTASONE) 10 MG tablet Take 2 tablets (20 mg total) by mouth daily. 09/01/22  Yes Flossie Dibble, NP  acetaminophen (TYLENOL) 325 MG tablet Take 2 tablets (650 mg total) by mouth every 6 (six) hours as needed. 05/28/21   Jaynee Eagles, PA-C  amLODipine (NORVASC) 10 MG tablet Take 1 tablet (10 mg total) by mouth daily. 09/01/22 10/01/22  Flossie Dibble, NP  aspirin 81 MG chewable tablet Chew 1 tablet (81 mg total) by mouth daily. Patient not taking: Reported on 12/09/2016 09/06/16   Mack Hook, MD  atorvastatin (LIPITOR) 40 MG tablet Take 1 tablet (40 mg total) daily at 6 PM by mouth. Patient not taking: Reported on 08/06/2018 09/25/17   Burgess Estelle, MD  metoprolol tartrate (LOPRESSOR) 25 MG tablet Take 1 tablet (25 mg total) by mouth 2 (two) times daily. Patient not taking: Reported on 08/06/2018 01/19/17   Mack Hook, MD  nitroGLYCERIN (NITROSTAT) 0.4 MG SL tablet Place 1 tablet (0.4 mg total) under the tongue every 5 (five) minutes as needed for chest pain. Max of 3 doses Patient not taking: Reported on 09/24/2017 05/31/17   Mack Hook, MD  tiZANidine (ZANAFLEX) 4 MG  tablet Take 1 tablet (4 mg total) by mouth every 8 (eight) hours as needed for muscle spasms. 09/01/22   Flossie Dibble, NP  triamcinolone cream (KENALOG) 0.1 % Apply twice daily to affected area. 05/28/21   Jaynee Eagles, PA-C    Family History Family History  Problem Relation Age of Onset   Coronary artery disease Father    Hodgkin's lymphoma Father        Hodgkin's Lymphoma--cause of death   Coronary artery disease Mother        Died of MI   Diabetes Mother    Hypertension Mother    Hyperlipidemia Mother    Coronary artery disease Sister 22       CABG   Diabetes Sister    Hypertension Sister    Hyperlipidemia Sister     Prostate cancer Brother 19   Seizures Son        Father describes absence seizures   Diabetes Sister    Hypertension Sister    Hyperlipidemia Sister    Coronary artery disease Brother        Died following complication following CABG   Hypertension Brother     Social History Social History   Tobacco Use   Smoking status: Every Day    Packs/day: 0.10    Years: 44.00    Total pack years: 4.40    Types: Cigarettes   Smokeless tobacco: Never   Tobacco comments:    Has not tried anything but cold [place]  Vaping Use   Vaping Use: Never used  Substance Use Topics   Alcohol use: Yes    Comment: beer occasional   Drug use: Yes    Types: Marijuana     Allergies   Patient has no known allergies.   Review of Systems Review of Systems  Constitutional:  Negative for activity change, chills and fever.  Genitourinary:  Negative for decreased urine volume, difficulty urinating, dysuria, flank pain, frequency, hematuria and urgency.  Musculoskeletal:  Positive for back pain (RT lower back) and myalgias. Negative for arthralgias.     Physical Exam Triage Vital Signs ED Triage Vitals  Enc Vitals Group     BP 09/01/22 1327 (!) 157/105     Pulse Rate 09/01/22 1327 73     Resp 09/01/22 1327 18     Temp 09/01/22 1327 98.2 F (36.8 C)     Temp Source 09/01/22 1327 Oral     SpO2 09/01/22 1327 99 %     Weight --      Height --      Head Circumference --      Peak Flow --      Pain Score 09/01/22 1328 9     Pain Loc --      Pain Edu? --      Excl. in [place]? --    No data found.  Updated Vital Signs BP (!) 157/105 (BP Location: Left Arm)   Pulse 73   Temp 98.2 F (36.8 C) (Oral)   Resp 18   SpO2 99%   Physical Exam Vitals and nursing note reviewed.  Musculoskeletal:     Cervical back: Normal.     Thoracic back: Normal.     Lumbar back: Spasms and tenderness present. No swelling, edema, deformity, signs of trauma, lacerations or bony tenderness. Normal range of motion.  Negative right straight leg raise test and negative left straight leg raise test. No scoliosis.     Comments: Tenderness upon palpation of RT  and LFT lower back.   Neurological:     General: No focal deficit present.      UC Treatments / Results  Labs (all labs ordered are listed, but only abnormal results are displayed) Labs Reviewed - No data to display  EKG   Radiology No results found.  Procedures Procedures (including critical care time)  Medications Ordered in UC Medications  dexamethasone (DECADRON) injection 10 mg (10 mg Intramuscular Given 09/01/22 1432)    Initial Impression / Assessment and Plan / UC Course  I have reviewed the triage vital signs and the nursing notes.  Pertinent labs & imaging results that were available during my care of the patient were reviewed by me and considered in my medical decision making (see chart for details).     Patient was evaluated for sciatica and muscle spasm.  Decadron injection given in office.  Prednisone and Zanaflex prescription was sent to the pharmacy.  Patient made aware of safety precautions with muscle relaxant.  Patient was given [facility] information if symptoms do not improve.  Amlodipine prescription was refilled due to patient running out of hypertension medication.  Patient was made aware of needing to check blood pressure at home. A PCP appointment was scheduled due to patient needing a primary care provider.  Patient verbalized understanding of instructions.  Final Clinical Impressions(s) / UC Diagnoses   Final diagnoses:  Sciatica of left side  Muscle spasm of back     Discharge Instructions      Prednisone has been sent to the pharmacy, you will take this medication in the morning for the next 5 days.  I advised that you take it in the morning as it can cause you to have difficulty sleeping if taken too late in the day.   Zanaflex as a muscle relaxant sent to the pharmacy, take this every 8 hours as  needed for the muscle spasms.  Please be mindful that this medication can make you sleepy, I advised that you do not drive or operate heavy machinery after taking this medication.   I have attached information for [facility], please follow-up with them if the symptoms are not improving.   A PCP appointment has been scheduled for you    ED Prescriptions     Medication Sig Dispense Auth. Provider   amLODipine (NORVASC) 10 MG tablet Take 1 tablet (10 mg total) by mouth daily. 30 tablet Flossie Dibble, NP   predniSONE (DELTASONE) 10 MG tablet Take 2 tablets (20 mg total) by mouth daily. 10 tablet Flossie Dibble, NP   tiZANidine (ZANAFLEX) 4 MG tablet Take 1 tablet (4 mg total) by mouth every 8 (eight) hours as needed for muscle spasms. 30 tablet Flossie Dibble, NP      PDMP not reviewed this encounter.   Flossie Dibble, NP 09/01/22 1556

## 2022-09-06 ENCOUNTER — Ambulatory Visit: Payer: Self-pay | Admitting: Family Medicine

## 2022-09-13 ENCOUNTER — Ambulatory Visit: Payer: Commercial Managed Care - HMO | Admitting: Internal Medicine

## 2023-01-02 ENCOUNTER — Ambulatory Visit (HOSPITAL_COMMUNITY)
Admission: EM | Admit: 2023-01-02 | Discharge: 2023-01-02 | Disposition: A | Discharge status: Home / Self Care | Payer: Self-pay | Attending: Family Medicine | Admitting: Family Medicine

## 2023-01-02 ENCOUNTER — Encounter (HOSPITAL_COMMUNITY): Payer: Self-pay

## 2023-01-02 DIAGNOSIS — I10 Essential (primary) hypertension: Secondary | ICD-10-CM

## 2023-01-02 DIAGNOSIS — M25522 Pain in left elbow: Secondary | ICD-10-CM

## 2023-01-02 DIAGNOSIS — M7712 Lateral epicondylitis, left elbow: Secondary | ICD-10-CM

## 2023-01-02 DIAGNOSIS — M542 Cervicalgia: Secondary | ICD-10-CM

## 2023-01-02 MED ORDER — DEXAMETHASONE SODIUM PHOSPHATE 10 MG/ML IJ SOLN
10.0000 mg | Freq: Once | INTRAMUSCULAR | Status: AC
  Administered 2023-01-02: 10 mg via INTRAMUSCULAR

## 2023-01-02 MED ORDER — DEXAMETHASONE SODIUM PHOSPHATE 10 MG/ML IJ SOLN
INTRAMUSCULAR | Status: AC
  Filled 2023-01-02: qty 1

## 2023-01-02 NOTE — ED Triage Notes (Signed)
Pt c/o lt arm pain from elbow up his neck since last Thursday. States pain constant pain. Denies injury. States last night had chest pain and SOB lasting 3 mins. States taking tylenol with no relief. States hasn't been on his B/P meds for 3 months.

## 2023-01-02 NOTE — Discharge Instructions (Addendum)
Your blood pressure was noted to be elevated during your visit today. If you are currently taking medication for high blood pressure, please ensure you are taking this as directed. If you do not have a history of high blood pressure and your blood pressure remains persistently elevated, you may need to begin taking a medication at some point. You may return here within the next few days to recheck if unable to see your primary care provider or if you do not have a one.  BP (!) 175/113 (BP Location: Right Arm)   Pulse 74   Temp (!) 97.4 F (36.3 C) (Oral)   Resp 18   SpO2 100%   BP Readings from Last 3 Encounters:  01/02/23 (!) 175/113  09/01/22 (!) 157/105  01/24/22 (!) 151/118

## 2023-01-04 NOTE — ED Provider Notes (Signed)
[place]   [id number] 01/02/23 Arrival Time: [id number]  ASSESSMENT & PLAN:  1. Left elbow pain   2. Cervical muscle pain   3. Elevated blood pressure reading with diagnosis of hypertension   4. Lateral epicondylitis of left elbow    No trauma. No indication for plain imaging at this time. No s/s of HTN urgency.  Will tx for lateral epicondylitis. Armband applied here.  Meds ordered this encounter  Medications   dexamethasone (DECADRON) injection 10 mg   ECG without acute changes NSR; no STEMI.  Orders Placed This Encounter  Procedures   Apply other splint   ED EKG   EKG 12-Lead   Work/school excuse note: provided. Recommend:  Follow-up Information     [place].   Why: If your elbow is worsening or failing to improve as anticipated. Contact information: [place] [facility] [place].   Specialty: Urgent Care Why: To recheck your blood pressure this week or next. Contact information: [place] [id number] [phone]               Reviewed expectations re: course of current medical issues. Questions answered. Outlined signs and symptoms indicating need for more acute intervention. Patient verbalized understanding. After Visit Summary given.  SUBJECTIVE: History from: patient. Bradley [name] is a 61 y.o. male who reports left arm pain, specifically elbow, that radiates up to her left neck. Denies CP/SOB/n/v/diaphoresis that is specifically related to left arm/neck pain. Does have h/o angina but this has not changed from his baseline; sporadic and random; did feel yesterday. No elbow trauma. Does use arms repetitively at work; questions relation. Denies extremity sensation changes or weakness.  Increased blood pressure noted today. Reports that he is treated for HTN. Does not take medications as  directed. Requests refills.  Past Surgical History:  Procedure Laterality Date   INCISION AND DRAINAGE ABSCESS     "boils; under right arm, hip, side"   [place]  ~ 1980   Lithotripsy to right, he believes   LEFT HEART CATHETERIZATION WITH CORONARY ANGIOGRAM N/A 12/12/2014   Procedure: LEFT HEART CATHETERIZATION WITH CORONARY ANGIOGRAM;  Surgeon: Burnell Blanks, MD;  Location: [facility] CATH LAB;  Service: Cardiovascular;  Laterality: N/A;      OBJECTIVE:  Vitals:   01/02/23 0942  BP: (!) 175/113  Pulse: 74  Resp: 18  Temp: (!) 97.4 F (36.3 C)  TempSrc: Oral  SpO2: 100%    General appearance: alert; no distress HEENT: [place]; AT Neck: supple with FROM Resp: unlabored respirations CV: RRR Extremities: LUE: warm with well perfused appearance; well localized moderate tenderness over left lateral epicondyle; without gross deformities; swelling: none; bruising: none; elbow ROM: normal CV: brisk extremity capillary refill of LUE; 2+ radial pulse of LUE. Skin: warm and dry; no visible rashes Neurologic: gait normal; normal sensation and strength of LUE Psychological: alert and cooperative; normal mood and affect   No Known Allergies  Past Medical History:  Diagnosis Date   Arthritis    "lower back by by butt; right shoulder" (12/10/2014)   Chronic back pain    Chronic sinusitis    Depression 05/31/2017   Eczema of both hands    Sometimes nickel allergy infraumbilical, flexural as well at times   Hearing loss    Hypertension 2015   Kidney stones Teen   Lithotripsy  he believes on the right   Sciatica    Seasonal allergies    Tobacco abuse    Social History   Socioeconomic History   Marital status: Divorced    Spouse name: Not on file   Number of children: 2   Years of education: 11   Highest education level: Not on file  Occupational History   Occupation: grill cook/prep    Employer: [employer]    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  Tobacco Use   Smoking  status: Every Day    Packs/day: 0.10    Years: 44.00    Total pack years: 4.40    Types: Cigarettes   Smokeless tobacco: Never   Tobacco comments:    Has not tried anything but cold [place]  Vaping Use   Vaping Use: Never used  Substance and Sexual Activity   Alcohol use: Yes    Comment: beer occasional   Drug use: Yes    Types: Marijuana   Sexual activity: Not Currently  Other Topics Concern   Not on file  Social History Narrative   Born and raised in [place] [employer] [place]   Finished 11th grade at [employer]   Was living in [place], but now living in [place] around the corner   [place], [place], living with him.   Daughter is 29 yo.  She lives in [place].  They are in touch.   Donell Beers. Is a 6th grader at [employer].  Working with Lorrin Goodell, [place].   Social Determinants of Health   Financial Resource Strain: Not on file  Food Insecurity: Not on file  Transportation Needs: Not on file  Physical Activity: Not on file  Stress: Not on file  Social Connections: Not on file   Family History  Problem Relation Age of Onset   Coronary artery disease Father    Hodgkin's lymphoma Father        Hodgkin's Lymphoma--cause of death   Coronary artery disease Mother        Died of MI   Diabetes Mother    Hypertension Mother    Hyperlipidemia Mother    Coronary artery disease Sister 48       CABG   Diabetes Sister    Hypertension Sister    Hyperlipidemia Sister    Prostate cancer Brother 87   Seizures Son        Father describes absence seizures   Diabetes Sister    Hypertension Sister    Hyperlipidemia Sister    Coronary artery disease Brother        Died following complication following CABG   Hypertension Brother    Past Surgical History:  Procedure Laterality Date   INCISION AND DRAINAGE ABSCESS     "boils; under right arm, hip, side"   KIDNEY STONE SURGERY  ~ 1980   Lithotripsy to right, he  believes   LEFT HEART CATHETERIZATION WITH CORONARY ANGIOGRAM N/A 12/12/2014   Procedure: LEFT HEART CATHETERIZATION WITH CORONARY ANGIOGRAM;  Surgeon: Burnell Blanks, MD;  Location: [facility] CATH LAB;  Service: Cardiovascular;  Laterality: N/A;       Vanessa Kick, MD 01/04/23 1101

## 2023-01-05 ENCOUNTER — Telehealth (HOSPITAL_COMMUNITY): Payer: Self-pay | Admitting: Family Medicine

## 2023-01-05 NOTE — Telephone Encounter (Signed)
Clarify work note; re-printed.

## 2023-01-06 ENCOUNTER — Ambulatory Visit: Payer: Medicaid Other | Admitting: Internal Medicine

## 2023-02-27 ENCOUNTER — Emergency Department (HOSPITAL_COMMUNITY): Payer: BLUE CROSS/BLUE SHIELD

## 2023-02-27 ENCOUNTER — Encounter (HOSPITAL_COMMUNITY): Payer: Self-pay

## 2023-02-27 ENCOUNTER — Emergency Department (HOSPITAL_COMMUNITY)
Admission: EM | Admit: 2023-02-27 | Discharge: 2023-02-27 | Disposition: A | Discharge status: Home / Self Care | Payer: BLUE CROSS/BLUE SHIELD | Attending: Emergency Medicine | Admitting: Emergency Medicine

## 2023-02-27 ENCOUNTER — Other Ambulatory Visit: Payer: Self-pay

## 2023-02-27 DIAGNOSIS — M5412 Radiculopathy, cervical region: Secondary | ICD-10-CM | POA: Diagnosis not present

## 2023-02-27 DIAGNOSIS — M542 Cervicalgia: Secondary | ICD-10-CM | POA: Diagnosis present

## 2023-02-27 MED ORDER — ACETAMINOPHEN 500 MG PO TABS
1000.0000 mg | ORAL_TABLET | Freq: Once | ORAL | Status: AC
  Administered 2023-02-27: 1000 mg via ORAL
  Filled 2023-02-27: qty 2

## 2023-02-27 MED ORDER — CELECOXIB 200 MG PO CAPS: 200.0000 mg | ORAL_CAPSULE | Freq: Two times a day (BID) | ORAL | 0 refills | Status: AC

## 2023-02-27 MED ORDER — ACETAMINOPHEN 500 MG PO TABS: 1000.0000 mg | ORAL_TABLET | Freq: Four times a day (QID) | ORAL | 0 refills | Status: AC | PRN

## 2023-02-27 MED ORDER — LIDOCAINE 5 % EX PTCH
1.0000 | MEDICATED_PATCH | CUTANEOUS | Status: DC
  Administered 2023-02-27: 1 via TRANSDERMAL
  Filled 2023-02-27: qty 1

## 2023-02-27 MED ORDER — KETOROLAC TROMETHAMINE 15 MG/ML IJ SOLN
15.0000 mg | Freq: Once | INTRAMUSCULAR | Status: AC
  Administered 2023-02-27: 15 mg via INTRAMUSCULAR
  Filled 2023-02-27: qty 1

## 2023-02-27 MED ORDER — LIDOCAINE 5 % EX PTCH: 1.0000 | MEDICATED_PATCH | CUTANEOUS | 0 refills | Status: DC

## 2023-02-27 NOTE — ED Provider Triage Note (Signed)
Emergency Medicine Provider Triage Evaluation Note  Bradley [name] , a 62 y.o. male  was evaluated in triage.  Pt complains of left elbow pain for the last 6 months.  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  Patient reports he does many repetitive motions.  Patient has a history of left elbow pain diagnosis medial epicondylitis in 2018.  The patient states he has been taking Voltaren gel which will help relieve the pain.  Patient denies overlying skin change, fevers.  Patient reports pain will radiate up into his left shoulder and then left side of his neck.  Review of Systems  Positive:  Negative:   Physical Exam  BP (!) 179/107 (BP Location: Right Arm)   Pulse 70   Temp (!) 97.3 F (36.3 C)   Resp 18   Ht  (1.676 m)   Wt 80.7 kg   SpO2 95%   BMI 28.72 kg/m  Gen:   Awake, no distress   Resp:  Normal effort  MSK:   Moves extremities without difficulty  Other:  No overlying skin change left elbow.  Full range of motion appreciated.  Medical Decision Making  Medically screening exam initiated at 10:42 AM.  Appropriate orders placed.  Bradley [name] was informed that the remainder of the evaluation will be completed by another provider, this initial triage assessment does not replace that evaluation, and the importance of remaining in the ED until their evaluation is complete.     Bradley Decant, PA-C 02/27/23 1043

## 2023-02-27 NOTE — ED Triage Notes (Signed)
Pt arrives via POV with c/o of left sided arm pain which started 2 weeks ago.Left side leg is numb as well.No hx of previous strokes.

## 2023-02-27 NOTE — ED Provider Notes (Signed)
[place] EMERGENCY DEPARTMENT AT [facility] Provider Note   CSN: [id number] Arrival date & time: 02/27/23  4098     History Chief Complaint  Patient presents with   Left arm pain    HPI Bradley [name] is a 62 y.o. male presenting for chief complaint of left arm pain.  States he has had shooting pain from the base of his left neck down into his left hand for the last 3 weeks.  Denies fevers chills nausea vomiting syncope shortness of breath or any recent traumas.  Otherwise ambulatory tolerating p.o. intake.  States he is to use his arm for work and has been causing worsening pain.  No known sick contacts.   Patient's recorded medical, surgical, social, medication list and allergies were reviewed in the Snapshot window as part of the initial history.   Review of Systems   Review of Systems  Constitutional:  Negative for chills and fever.  HENT:  Negative for ear pain and sore throat.   Eyes:  Negative for pain and visual disturbance.  Respiratory:  Negative for cough and shortness of breath.   Cardiovascular:  Negative for chest pain and palpitations.  Gastrointestinal:  Negative for abdominal pain and vomiting.  Genitourinary:  Negative for dysuria and hematuria.  Musculoskeletal:  Negative for arthralgias and back pain.  Skin:  Negative for color change and rash.  Neurological:  Negative for seizures and syncope.  All other systems reviewed and are negative.   Physical Exam Updated Vital Signs BP (!) 175/108   Pulse 63   Temp (!) 97.3 F (36.3 C)   Resp 15   Ht  (1.676 m)   Wt 80.7 kg   SpO2 100%   BMI 28.72 kg/m  Physical Exam Vitals and nursing note reviewed.  Constitutional:      General: He is not in acute distress.    Appearance: He is well-developed.  HENT:     Head: Normocephalic and atraumatic.  Eyes:     Conjunctiva/sclera: Conjunctivae normal.  Cardiovascular:     Rate and Rhythm: Normal rate and regular rhythm.     Heart sounds:  No murmur heard. Pulmonary:     Effort: Pulmonary effort is normal. No respiratory distress.     Breath sounds: Normal breath sounds.  Abdominal:     Palpations: Abdomen is soft.     Tenderness: There is no abdominal tenderness.  Musculoskeletal:        General: No swelling.     Cervical back: Neck supple.  Skin:    General: Skin is warm and dry.     Capillary Refill: Capillary refill takes less than 2 seconds.  Neurological:     Mental Status: He is alert.  Psychiatric:        Mood and Affect: Mood normal.      ED Course/ Medical Decision Making/ A&P    Procedures Procedures   Medications Ordered in ED Medications  acetaminophen (TYLENOL) tablet 1,000 mg (has no administration in time range)  ketorolac (TORADOL) 15 MG/ML injection 15 mg (has no administration in time range)  lidocaine (LIDODERM) 5 % 1 patch (has no administration in time range)    Medical Decision Making:   Patient history of present illness and physical exam findings most consistent with cervical radiculopathy radiating from left cervical nerves.  Radiating down the left shoulder. Hemodynamically stable no acute distress.  X-ray performed in triage with no focal orthopedic injuries. Have discussed symptomatic management with the patient.  Given no neurologic symptoms at this time, no acute indication for emergent intervention by spinal team.  Will trial supportive care and plan for outpatient follow-up with a primary care provider. Will prescribe NSAIDs, Tylenol, lidocaine patches and recommend close follow-up with primary care Contact information for spinal center provided to patient should he have interval worsening. Disposition:  I have considered need for hospitalization, however, considering all of the above, I believe this patient is stable for discharge at this time.  Patient/family educated about specific return precautions for given chief complaint and symptoms.  Patient/family educated about  follow-up with PCP .     Patient/family expressed understanding of return precautions and need for follow-up. Patient spoken to regarding all imaging and laboratory results and appropriate follow up for these results. All education provided in verbal form with additional information in written form. Time was allowed for answering of patient questions. Patient discharged.    Emergency Department Medication Summary:   Medications  acetaminophen (TYLENOL) tablet 1,000 mg (has no administration in time range)  ketorolac (TORADOL) 15 MG/ML injection 15 mg (has no administration in time range)  lidocaine (LIDODERM) 5 % 1 patch (has no administration in time range)        Clinical Impression:  1. Cervical radiculopathy      Discharge   Final Clinical Impression(s) / ED Diagnoses Final diagnoses:  Cervical radiculopathy    Rx / DC Orders ED Discharge Orders          Ordered    acetaminophen (TYLENOL) 500 MG tablet  Every 6 hours PRN        02/27/23 1202    celecoxib (CELEBREX) 200 MG capsule  2 times daily        02/27/23 1202    lidocaine (LIDODERM) 5 %  Every 24 hours        02/27/23 1202              Glyn Ade, MD 02/27/23 1203

## 2023-07-05 ENCOUNTER — Other Ambulatory Visit: Payer: Self-pay

## 2023-07-05 ENCOUNTER — Emergency Department (HOSPITAL_COMMUNITY)
Admission: EM | Admit: 2023-07-05 | Discharge: 2023-07-05 | Disposition: A | Discharge status: Home / Self Care | Payer: BLUE CROSS/BLUE SHIELD | Attending: Emergency Medicine | Admitting: Emergency Medicine

## 2023-07-05 ENCOUNTER — Emergency Department (HOSPITAL_COMMUNITY): Payer: BLUE CROSS/BLUE SHIELD

## 2023-07-05 DIAGNOSIS — Z79899 Other long term (current) drug therapy: Secondary | ICD-10-CM | POA: Insufficient documentation

## 2023-07-05 DIAGNOSIS — I10 Essential (primary) hypertension: Secondary | ICD-10-CM | POA: Insufficient documentation

## 2023-07-05 DIAGNOSIS — Z7982 Long term (current) use of aspirin: Secondary | ICD-10-CM | POA: Insufficient documentation

## 2023-07-05 DIAGNOSIS — M5410 Radiculopathy, site unspecified: Secondary | ICD-10-CM

## 2023-07-05 DIAGNOSIS — K5732 Diverticulitis of large intestine without perforation or abscess without bleeding: Secondary | ICD-10-CM | POA: Diagnosis not present

## 2023-07-05 DIAGNOSIS — K5792 Diverticulitis of intestine, part unspecified, without perforation or abscess without bleeding: Secondary | ICD-10-CM

## 2023-07-05 DIAGNOSIS — R1030 Lower abdominal pain, unspecified: Secondary | ICD-10-CM | POA: Diagnosis present

## 2023-07-05 LAB — BASIC METABOLIC PANEL
Anion gap: 10 (ref 5–15)
BUN: 13 mg/dL (ref 8–23)
CO2: 21 mmol/L — ABNORMAL LOW (ref 22–32)
Calcium: 9.3 mg/dL (ref 8.9–10.3)
Chloride: 105 mmol/L (ref 98–111)
Creatinine, Ser: 0.95 mg/dL (ref 0.61–1.24)
GFR, Estimated: >60 mL/min (ref >60)
Glucose, Bld: 95 mg/dL (ref 70–99)
Potassium: 3.7 mmol/L (ref 3.5–5.1)
Sodium: 136 mmol/L (ref 135–145)

## 2023-07-05 LAB — CBC
HCT: 45.1 % (ref 39.0–52.0)
Hemoglobin: 15 g/dL (ref 13.0–17.0)
MCH: 31.3 pg (ref 26.0–34.0)
MCHC: 33.3 g/dL (ref 30.0–36.0)
MCV: 94 fL (ref 80.0–100.0)
Platelets: 287 10*3/uL (ref 150–400)
RBC: 4.8 MIL/uL (ref 4.22–5.81)
RDW: 12.3 % (ref 11.5–15.5)
WBC: 11.5 10*3/uL — ABNORMAL HIGH (ref 4.0–10.5)
nRBC: 0 % (ref 0.0–0.2)

## 2023-07-05 LAB — URINALYSIS, ROUTINE W REFLEX MICROSCOPIC
Bilirubin Urine: NEGATIVE
Glucose, UA: NEGATIVE mg/dL
Ketones, ur: NEGATIVE mg/dL
Leukocytes,Ua: NEGATIVE
Nitrite: NEGATIVE
Protein, ur: NEGATIVE mg/dL
Specific Gravity, Urine: >1.046 — ABNORMAL HIGH (ref 1.005–1.030)
pH: 5 (ref 5.0–8.0)

## 2023-07-05 MED ORDER — ONDANSETRON HCL 4 MG/2ML IJ SOLN
4.0000 mg | Freq: Once | INTRAMUSCULAR | Status: AC
  Administered 2023-07-05: 4 mg via INTRAVENOUS
  Filled 2023-07-05: qty 2

## 2023-07-05 MED ORDER — AMOXICILLIN-POT CLAVULANATE 875-125 MG PO TABS: 1.0000 | ORAL_TABLET | Freq: Two times a day (BID) | ORAL | 0 refills | Status: DC

## 2023-07-05 MED ORDER — AMLODIPINE BESYLATE 10 MG PO TABS: 10.0000 mg | ORAL_TABLET | Freq: Every day | ORAL | 0 refills | Status: DC

## 2023-07-05 MED ORDER — METOPROLOL TARTRATE 25 MG PO TABS
25.0000 mg | ORAL_TABLET | Freq: Once | ORAL | Status: AC
  Administered 2023-07-05: 25 mg via ORAL
  Filled 2023-07-05: qty 1

## 2023-07-05 MED ORDER — METOPROLOL TARTRATE 25 MG PO TABS: 25.0000 mg | ORAL_TABLET | Freq: Two times a day (BID) | ORAL | 0 refills | Status: AC

## 2023-07-05 MED ORDER — AMLODIPINE BESYLATE 5 MG PO TABS
5.0000 mg | ORAL_TABLET | Freq: Once | ORAL | Status: AC
  Administered 2023-07-05: 5 mg via ORAL
  Filled 2023-07-05: qty 1

## 2023-07-05 MED ORDER — PIPERACILLIN-TAZOBACTAM 3.375 G IVPB 30 MIN
3.3750 g | Freq: Once | INTRAVENOUS | Status: AC
  Administered 2023-07-05: 3.375 g via INTRAVENOUS
  Filled 2023-07-05: qty 50

## 2023-07-05 MED ORDER — SODIUM CHLORIDE 0.9 % IV BOLUS
1000.0000 mL | Freq: Once | INTRAVENOUS | Status: AC
  Administered 2023-07-05: 1000 mL via INTRAVENOUS

## 2023-07-05 MED ORDER — IOHEXOL 350 MG/ML SOLN
75.0000 mL | Freq: Once | INTRAVENOUS | Status: AC | PRN
  Administered 2023-07-05: 75 mL via INTRAVENOUS

## 2023-07-05 MED ORDER — MORPHINE SULFATE (PF) 4 MG/ML IV SOLN
4.0000 mg | Freq: Once | INTRAVENOUS | Status: AC
  Administered 2023-07-05: 4 mg via INTRAVENOUS
  Filled 2023-07-05: qty 1

## 2023-07-05 NOTE — ED Provider Notes (Signed)
[place] [facility] AT [facility] Provider Note   CSN: [id number] Arrival date & time: 07/05/23  5643     History  Chief Complaint  Patient presents with   Abdominal Pain   Arm Pain    Bradley [name] is a 62 y.o. male.  Pt c/o lower abdominal pain in past couple days. Dull pain, non radiating, worse w palpation. No dysuria or hematuria. Feels is able to void. Is having normal bms. No back/flank pain, no scrotal or testicular pain. No fever or chills.   The history is provided by the patient and medical records.  Abdominal Pain Associated symptoms: no chest pain, no constipation, no diarrhea, no dysuria, no fever, no shortness of breath, no sore throat and no vomiting   Arm Pain Associated symptoms include abdominal pain. Pertinent negatives include no chest pain, no headaches and no shortness of breath.       Home Medications Prior to Admission medications   Medication Sig Start Date End Date Taking? Authorizing Provider  acetaminophen (TYLENOL) 500 MG tablet Take 2 tablets (1,000 mg total) by mouth every 6 (six) hours as needed. 02/27/23   Glyn Ade, MD  amLODipine (NORVASC) 10 MG tablet Take 1 tablet (10 mg total) by mouth daily. Patient not taking: Reported on 01/02/2023 09/01/22 10/01/22  Debby Freiberg, NP  aspirin 81 MG chewable tablet Chew 1 tablet (81 mg total) by mouth daily. Patient not taking: Reported on 12/09/2016 09/06/16   Julieanne Manson, MD  atorvastatin (LIPITOR) 40 MG tablet Take 1 tablet (40 mg total) daily at 6 PM by mouth. Patient not taking: Reported on 08/06/2018 09/25/17   Deneise Lever, MD  lidocaine (LIDODERM) 5 % Place 1 patch onto the skin daily. Remove & Discard patch within 12 hours or as directed by MD 02/27/23   Glyn Ade, MD  metoprolol tartrate (LOPRESSOR) 25 MG tablet Take 1 tablet (25 mg total) by mouth 2 (two) times daily. Patient not taking: Reported on 08/06/2018 01/19/17   Julieanne Manson,  MD  nitroGLYCERIN (NITROSTAT) 0.4 MG SL tablet Place 1 tablet (0.4 mg total) under the tongue every 5 (five) minutes as needed for chest pain. Max of 3 doses Patient not taking: Reported on 09/24/2017 05/31/17   Julieanne Manson, MD      Allergies    Patient has no known allergies.    Review of Systems   Review of Systems  Constitutional:  Negative for fever.  HENT:  Negative for sore throat.   Eyes:  Negative for redness.  Respiratory:  Negative for shortness of breath.   Cardiovascular:  Negative for chest pain.  Gastrointestinal:  Positive for abdominal pain. Negative for constipation, diarrhea and vomiting.  Genitourinary:  Negative for dysuria and flank pain.  Musculoskeletal:  Negative for back pain.  Skin:  Negative for rash.  Neurological:  Negative for speech difficulty, weakness, numbness and headaches.  Hematological:  Does not bruise/bleed easily.  Psychiatric/Behavioral:  Negative for confusion.     Physical Exam Updated Vital Signs BP (!) 157/118 (BP Location: Left Arm)   Pulse 72   Temp 98.2 F (36.8 C) (Oral)   Resp 18   Ht 1.676 m (5\' 6" )   Wt 80.7 kg   SpO2 100%   BMI 28.73 kg/m  Physical Exam Vitals and nursing note reviewed.  Constitutional:      Appearance: Normal appearance. He is well-developed.  HENT:     Head: Atraumatic.     Nose: Nose normal.  Mouth/Throat:     Mouth: Mucous membranes are moist.     Pharynx: Oropharynx is clear.  Eyes:     General: No scleral icterus.    Conjunctiva/sclera: Conjunctivae normal.     Pupils: Pupils are equal, round, and reactive to light.  Neck:     Trachea: No tracheal deviation.  Cardiovascular:     Rate and Rhythm: Normal rate and regular rhythm.     Pulses: Normal pulses.     Heart sounds: Normal heart sounds. No murmur heard.    No friction rub. No gallop.  Pulmonary:     Effort: Pulmonary effort is normal. No accessory muscle usage or respiratory distress.     Breath sounds: Normal breath  sounds.  Abdominal:     General: Bowel sounds are normal. There is no distension.     Palpations: Abdomen is soft. There is no mass.     Tenderness: There is abdominal tenderness. There is no guarding.     Comments: LLQ tenderness.  Genitourinary:    Comments: No cva tenderness. Musculoskeletal:        General: No swelling or tenderness.     Cervical back: Normal range of motion and neck supple. No rigidity.     Right lower leg: No edema.     Left lower leg: No edema.  Skin:    General: Skin is warm and dry.     Findings: No rash.  Neurological:     Mental Status: He is alert.     Comments: Alert, speech clear. Motor/sens grossly intact. Steady gait.   Psychiatric:        Mood and Affect: Mood normal.     ED Results / Procedures / Treatments   Labs (all labs ordered are listed, but only abnormal results are displayed) Results for orders placed or performed during the hospital encounter of 07/05/23  CBC  Result Value Ref Range   WBC 11.5 (H) 4.0 - 10.5 K/uL   RBC 4.80 4.22 - 5.81 MIL/uL   Hemoglobin 15.0 13.0 - 17.0 g/dL   HCT 47.8 29.5 - 62.1 %   MCV 94.0 80.0 - 100.0 fL   MCH 31.3 26.0 - 34.0 pg   MCHC 33.3 30.0 - 36.0 g/dL   RDW 30.8 65.7 - 84.6 %   Platelets 287 150 - 400 K/uL   nRBC 0.0 0.0 - 0.2 %  Basic metabolic panel  Result Value Ref Range   Sodium 136 135 - 145 mmol/L   Potassium 3.7 3.5 - 5.1 mmol/L   Chloride 105 98 - 111 mmol/L   CO2 21 (L) 22 - 32 mmol/L   Glucose, Bld 95 70 - 99 mg/dL   BUN 13 8 - 23 mg/dL   Creatinine, Ser 9.62 0.61 - 1.24 mg/dL   Calcium 9.3 8.9 - 95.2 mg/dL   GFR, Estimated >84 >13 mL/min   Anion gap 10 5 - 15  Urinalysis, Routine w reflex microscopic -Urine, Clean Catch  Result Value Ref Range   Color, Urine YELLOW YELLOW   APPearance CLEAR CLEAR   Specific Gravity, Urine >1.046 (H) 1.005 - 1.030   pH 5.0 5.0 - 8.0   Glucose, UA NEGATIVE NEGATIVE mg/dL   Hgb urine dipstick SMALL (A) NEGATIVE   Bilirubin Urine NEGATIVE  NEGATIVE   Ketones, ur NEGATIVE NEGATIVE mg/dL   Protein, ur NEGATIVE NEGATIVE mg/dL   Nitrite NEGATIVE NEGATIVE   Leukocytes,Ua NEGATIVE NEGATIVE   RBC / HPF 0-5 0 - 5 RBC/hpf  WBC, UA 0-5 0 - 5 WBC/hpf   Bacteria, UA RARE (A) NONE SEEN   Squamous Epithelial / HPF 0-5 0 - 5 /HPF   Mucus PRESENT      EKG None  Radiology CT ABDOMEN PELVIS W CONTRAST  Result Date: 07/05/2023 CLINICAL DATA:  Abdominal pain, acute, nonlocalized. EXAM: CT ABDOMEN AND PELVIS WITH CONTRAST TECHNIQUE: Multidetector CT imaging of the abdomen and pelvis was performed using the standard protocol following bolus administration of intravenous contrast. RADIATION DOSE REDUCTION: This exam was performed according to the departmental dose-optimization program which includes automated exposure control, adjustment of the mA and/or kV according to patient size and/or use of iterative reconstruction technique. CONTRAST:  75mL OMNIPAQUE IOHEXOL 350 MG/ML SOLN COMPARISON:  CT of the abdomen and pelvis with contrast 08/06/2018 FINDINGS: Lower chest: The lung bases are clear without focal nodule, mass, or airspace disease. The heart size is normal. Hepatobiliary: Mild fatty infiltration of the liver is present. No discrete lesions are present. The common bile duct and gallbladder are normal. Pancreas: Unremarkable. No pancreatic ductal dilatation or surrounding inflammatory changes. Spleen: Normal in size without focal abnormality. Adrenals/Urinary Tract: Adrenal glands are normal bilaterally. A 3 mm nonobstructing stone is present at the lower pole of the right kidney. No obstructing no other stones are present. No obstruction is present. A 12 mm simple cyst is present in the medial aspect of the left kidney. No other focal lesions are present. The ureters are within normal limits. Urinary bladder is normal. Stomach/Bowel: Stomach and duodenum are within normal limits. Small bowel is unremarkable. The terminal ileum is normal. The  appendix is visualized and within normal limits. Diverticular changes are again noted within the ascending colon and the descending colon. Diverticular changes are present the sigmoid colon with mild inflammatory change in the proximal sigmoid colon. No free air or free fluid is associated. Vascular/Lymphatic: Atherosclerotic calcifications are present in the aorta and branch vessels. No aneurysm is present. No significant adenopathy is present. Reproductive: The prostate is enlarged, measuring 5.5 cm in transverse dimension. Other: No abdominal wall hernia or abnormality. No abdominopelvic ascites. Musculoskeletal: Slight degenerative anterolisthesis at L4-5 is stable. No other significant listhesis is present. Straightening of the normal lumbar lordosis is stable. No focal osseous lesions are present. Bony pelvis is within normal limits. Moderate degenerative changes are present in the hips bilaterally. IMPRESSION: 1. Colonic diverticulosis with mild inflammatory change in the proximal sigmoid colon compatible with acute diverticulitis. No free air or free fluid is associated. 2. Hepatic steatosis. 3. Nonobstructing 3 mm stone at the lower pole of the right kidney. 4. 12 mm simple cyst in the left kidney. No follow-up imaging is recommended. JACR 2018 Feb; 264-273, Management of the Incidental Renal Mass on CT, [employer] 2021; 814-848, Bosniak Classification of Cystic Renal Masses, Version 2019. 5. Prostatomegaly without obstruction. 6. 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 7.  Aortic Atherosclerosis (ICD10-I70.0). Electronically Signed   By: Marin Roberts M.D.   On: 07/05/2023 12:00    Procedures Procedures    Medications Ordered in ED Medications  piperacillin-tazobactam (ZOSYN) IVPB 3.375 g (has no administration in time range)  sodium chloride 0.9 % bolus 1,000 mL (1,000 mLs Intravenous New Bag/Given 07/05/23 0921)  morphine (PF) 4 MG/ML injection 4 mg (4 mg Intravenous  Given 07/05/23 0921)  ondansetron (ZOFRAN) injection 4 mg (4 mg Intravenous Given 07/05/23 0921)  iohexol (OMNIPAQUE) 350 MG/ML injection 75 mL (75 mLs Intravenous Contrast Given 07/05/23 1127)  ED Course/ Medical Decision Making/ A&P                                 Medical Decision Making Problems Addressed: Acute diverticulitis: acute illness or injury with systemic symptoms that poses a threat to life or bodily functions Essential hypertension: chronic illness or injury with exacerbation, progression, or side effects of treatment that poses a threat to life or bodily functions Radicular pain: chronic illness or injury Uncontrolled hypertension: chronic illness or injury with exacerbation, progression, or side effects of treatment that poses a threat to life or bodily functions  Amount and/or Complexity of Data Reviewed External Data Reviewed: notes. Labs: ordered. Decision-making details documented in ED Course. Radiology: ordered and independent interpretation performed. Decision-making details documented in ED Course.  Risk Prescription drug management. Parenteral controlled substances. Decision regarding hospitalization.   Iv ns. Continuous pulse ox and cardiac monitoring. Labs ordered/sent. Imaging ordered.   Differential diagnosis includes diverticulitis, uti/pyelo, etc. Dispo decision including potential need for admission considered - will get labs and imaging and reassess.   Reviewed nursing notes and prior charts for additional history. External reports reviewed.   Cardiac monitor: sinus rhythm, rate 80.  IVF bolus. Morphine iv. Zofran iv.   Labs reviewed/interpreted by me - chem normal. Wbc mildly high.   CT reviewed/interpreted by me - +diverticulitis, no abscess. Zosyn iv.   Pt with hx htn, states not taking meds for months. No headache, no neuro c/o, no swelling, no cp or sob.   Amlodipine po. Rx for home. Discussed pcp f/u 1 week.  For long time (many  months) pt indicates intermittently sharp pain to left lateral neck/left trapezius area that radiates down towards left elbow. No recent or acute change in this pain. No new numbness/weakness. No LUE swelling. C spine non tender, aligned. Good passive rom at left shoulder and elbow without pain. No edema. Radial pulse 2+. Suspect possible chronic radicular pain/symptoms. Rec pcp f/u for same.    No vomiting. Overall well, not toxic appearing. No peritoneal signs on abd exam. Pt currently appears stable for d/c.   Rec close pcp f/u.  Return precautions provided.          Final Clinical Impression(s) / ED Diagnoses Final diagnoses:  Acute diverticulitis  Essential hypertension  Uncontrolled hypertension    Rx / DC Orders ED Discharge Orders     None         Cathren Laine, MD 07/05/23 1252

## 2023-07-05 NOTE — Discharge Instructions (Addendum)
It was our pleasure to provide your ER care today - we hope that you feel better.  Your ct scan shows diverticulitis - see attached info.  Incidental note was aolso made of right kidney stone, a 12 mm left kidney cyst, enlarged prostate, and atherosclerosis - follow up for these findings with primary care doctor and have them review your imaging results.   Take antibiotic (augmentin) as prescribed. Drink plenty of fluids/stay well hydrated. Take acetaminophen or ibuprofen as need.   Your blood pressure is also high - follow heart healthy eating plan, take blood pressure medications as prescribed. For this issue as well as chronic radiating pain to arm, follow up with primary care doctor in one week.  Return to ER if worse, new symptoms, new or worsening or severe abdominal pain, persistent vomiting, high fevers, or other concern.   You were given pain meds in the ER - no driving for the next 4 hours.

## 2023-07-05 NOTE — ED Notes (Signed)
Patient transported to CT 

## 2023-07-05 NOTE — ED Triage Notes (Signed)
Pt. Stated, Bradley [name] had stomach pain that started yesterday. Its the lower part of my stomach. No N/V/D normal bowel move ment this morning,but when I pee I have to turn around and pee again like Im not emptying.

## 2023-08-30 ENCOUNTER — Emergency Department (HOSPITAL_COMMUNITY)
Admission: EM | Admit: 2023-08-30 | Discharge: 2023-08-30 | Disposition: A | Discharge status: Home / Self Care | Payer: BLUE CROSS/BLUE SHIELD | Attending: Emergency Medicine | Admitting: Emergency Medicine

## 2023-08-30 ENCOUNTER — Other Ambulatory Visit: Payer: Self-pay

## 2023-08-30 ENCOUNTER — Emergency Department (HOSPITAL_COMMUNITY): Payer: BLUE CROSS/BLUE SHIELD

## 2023-08-30 DIAGNOSIS — Z79899 Other long term (current) drug therapy: Secondary | ICD-10-CM | POA: Diagnosis not present

## 2023-08-30 DIAGNOSIS — R1032 Left lower quadrant pain: Secondary | ICD-10-CM | POA: Diagnosis present

## 2023-08-30 DIAGNOSIS — K5732 Diverticulitis of large intestine without perforation or abscess without bleeding: Secondary | ICD-10-CM | POA: Diagnosis not present

## 2023-08-30 DIAGNOSIS — K5792 Diverticulitis of intestine, part unspecified, without perforation or abscess without bleeding: Secondary | ICD-10-CM

## 2023-08-30 DIAGNOSIS — I10 Essential (primary) hypertension: Secondary | ICD-10-CM | POA: Diagnosis not present

## 2023-08-30 DIAGNOSIS — R030 Elevated blood-pressure reading, without diagnosis of hypertension: Secondary | ICD-10-CM

## 2023-08-30 LAB — URINALYSIS, ROUTINE W REFLEX MICROSCOPIC
Bilirubin Urine: NEGATIVE
Glucose, UA: NEGATIVE mg/dL
Ketones, ur: NEGATIVE mg/dL
Leukocytes,Ua: NEGATIVE
Nitrite: NEGATIVE
Protein, ur: NEGATIVE mg/dL
Specific Gravity, Urine: 1.025 (ref 1.005–1.030)
pH: 5 (ref 5.0–8.0)

## 2023-08-30 LAB — CBC
HCT: 44.9 % (ref 39.0–52.0)
Hemoglobin: 14.9 g/dL (ref 13.0–17.0)
MCH: 30.6 pg (ref 26.0–34.0)
MCHC: 33.2 g/dL (ref 30.0–36.0)
MCV: 92.2 fL (ref 80.0–100.0)
Platelets: 234 10*3/uL (ref 150–400)
RBC: 4.87 MIL/uL (ref 4.22–5.81)
RDW: 13.8 % (ref 11.5–15.5)
WBC: 8.9 10*3/uL (ref 4.0–10.5)
nRBC: 0 % (ref 0.0–0.2)

## 2023-08-30 LAB — COMPREHENSIVE METABOLIC PANEL
ALT: 26 U/L (ref 0–44)
AST: 23 U/L (ref 15–41)
Albumin: 4 g/dL (ref 3.5–5.0)
Alkaline Phosphatase: 81 U/L (ref 38–126)
Anion gap: 11 (ref 5–15)
BUN: 17 mg/dL (ref 8–23)
CO2: 21 mmol/L — ABNORMAL LOW (ref 22–32)
Calcium: 9.6 mg/dL (ref 8.9–10.3)
Chloride: 104 mmol/L (ref 98–111)
Creatinine, Ser: 0.94 mg/dL (ref 0.61–1.24)
GFR, Estimated: >60 mL/min (ref >60)
Glucose, Bld: 98 mg/dL (ref 70–99)
Potassium: 4 mmol/L (ref 3.5–5.1)
Sodium: 136 mmol/L (ref 135–145)
Total Bilirubin: 1.2 mg/dL (ref 0.3–1.2)
Total Protein: 8 g/dL (ref 6.5–8.1)

## 2023-08-30 LAB — LIPASE, BLOOD: Lipase: 28 U/L (ref 11–51)

## 2023-08-30 MED ORDER — IOHEXOL 350 MG/ML SOLN
75.0000 mL | Freq: Once | INTRAVENOUS | Status: AC | PRN
  Administered 2023-08-30: 75 mL via INTRAVENOUS

## 2023-08-30 MED ORDER — ONDANSETRON HCL 4 MG/2ML IJ SOLN
4.0000 mg | Freq: Once | INTRAMUSCULAR | Status: AC
  Administered 2023-08-30: 4 mg via INTRAVENOUS
  Filled 2023-08-30: qty 2

## 2023-08-30 MED ORDER — MORPHINE SULFATE (PF) 4 MG/ML IV SOLN
4.0000 mg | Freq: Once | INTRAVENOUS | Status: AC
  Administered 2023-08-30: 4 mg via INTRAVENOUS
  Filled 2023-08-30: qty 1

## 2023-08-30 MED ORDER — AMOXICILLIN-POT CLAVULANATE 875-125 MG PO TABS: 1.0000 | ORAL_TABLET | Freq: Two times a day (BID) | ORAL | 0 refills | Status: DC

## 2023-08-30 MED ORDER — AMOXICILLIN-POT CLAVULANATE 875-125 MG PO TABS
1.0000 | ORAL_TABLET | Freq: Once | ORAL | Status: AC
  Administered 2023-08-30: 1 via ORAL
  Filled 2023-08-30: qty 1

## 2023-08-30 NOTE — Discharge Instructions (Addendum)
It was our pleasure to provide your ER care today - we hope that you feel better.  Drink plenty of fluids/stay well hydrated.  Take antibiotic (augmentin) as prescribed.  Take acetaminophen or ibuprofen as need.   Follow up with primary care doctor in the coming week if symptoms fail to improve/resolve. Also follow up with your doctor in the next 1-2 weeks regarding your blood pressure that is high today.   Return to ER if worse, new symptoms, fevers, new or worsening or severe abdominal pain, persistent vomiting, weak/fainting, or other concern.   You were given pain meds in the ER - no driving for the next 6 hours.

## 2023-08-30 NOTE — ED Provider Notes (Signed)
[place] EMERGENCY DEPARTMENT AT [facility] Provider Note   CSN: [id number] Arrival date & time: 08/30/23  4580     History  Chief Complaint  Patient presents with   Abdominal Pain   Urinary Retention   Diarrhea   Generalized Body Aches   Eye Problem    Bradley [name] is a 62 y.o. male.  Pt with LLQ abd pain in past couple days. Is having bms, one was loose. No bloody bms or melena. No abd distension or vomiting. No dysuria or hematuria. No scrotal or testicle pain. No back/flank pain. No fever or chills.   The history is provided by the patient and medical records.  Abdominal Pain Associated symptoms: diarrhea   Associated symptoms: no chest pain, no chills, no dysuria, no fever, no shortness of breath, no sore throat and no vomiting   Diarrhea Associated symptoms: abdominal pain   Associated symptoms: no chills, no fever, no headaches and no vomiting   Eye Problem Associated symptoms: no headaches, no redness and no vomiting        Home Medications Prior to Admission medications   Medication Sig Start Date End Date Taking? Authorizing Provider  acetaminophen (TYLENOL) 500 MG tablet Take 2 tablets (1,000 mg total) by mouth every 6 (six) hours as needed. 02/27/23   Glyn Ade, MD  amLODipine (NORVASC) 10 MG tablet Take 1 tablet (10 mg total) by mouth daily. 07/05/23   Cathren Laine, MD  amoxicillin-clavulanate (AUGMENTIN) 875-125 MG tablet Take 1 tablet by mouth every 12 (twelve) hours. 07/05/23   Cathren Laine, MD  aspirin 81 MG chewable tablet Chew 1 tablet (81 mg total) by mouth daily. Patient not taking: Reported on 12/09/2016 09/06/16   Julieanne Manson, MD  atorvastatin (LIPITOR) 40 MG tablet Take 1 tablet (40 mg total) daily at 6 PM by mouth. Patient not taking: Reported on 08/06/2018 09/25/17   Deneise Lever, MD  lidocaine (LIDODERM) 5 % Place 1 patch onto the skin daily. Remove & Discard patch within 12 hours or as directed by MD 02/27/23    Glyn Ade, MD  metoprolol tartrate (LOPRESSOR) 25 MG tablet Take 1 tablet (25 mg total) by mouth 2 (two) times daily. 07/05/23   Cathren Laine, MD  nitroGLYCERIN (NITROSTAT) 0.4 MG SL tablet Place 1 tablet (0.4 mg total) under the tongue every 5 (five) minutes as needed for chest pain. Max of 3 doses Patient not taking: Reported on 09/24/2017 05/31/17   Julieanne Manson, MD      Allergies    Patient has no known allergies.    Review of Systems   Review of Systems  Constitutional:  Negative for chills and fever.  HENT:  Negative for sore throat.   Eyes:  Negative for redness.  Respiratory:  Negative for shortness of breath.   Cardiovascular:  Negative for chest pain.  Gastrointestinal:  Positive for abdominal pain and diarrhea. Negative for vomiting.  Genitourinary:  Negative for dysuria, flank pain and testicular pain.  Musculoskeletal:  Negative for back pain.  Skin:  Negative for rash.  Neurological:  Negative for headaches.  Hematological:  Does not bruise/bleed easily.  Psychiatric/Behavioral:  Negative for confusion.     Physical Exam Updated Vital Signs BP (!) 143/107   Pulse 84   Temp 97.9 F (36.6 C)   Resp 18   Ht 1.676 m (5\' 6" )   Wt 83.9 kg   SpO2 98%   BMI 29.86 kg/m  Physical Exam Vitals and nursing note reviewed.  Constitutional:      Appearance: Normal appearance. He is well-developed.  HENT:     Head: Atraumatic.     Nose: Nose normal.     Mouth/Throat:     Pharynx: Oropharynx is clear.  Eyes:     General: No scleral icterus.    Conjunctiva/sclera: Conjunctivae normal.  Neck:     Trachea: No tracheal deviation.  Cardiovascular:     Rate and Rhythm: Normal rate and regular rhythm.     Pulses: Normal pulses.     Heart sounds: Normal heart sounds. No murmur heard.    No friction rub. No gallop.  Pulmonary:     Effort: Pulmonary effort is normal. No accessory muscle usage or respiratory distress.     Breath sounds: Normal breath sounds.   Abdominal:     General: Bowel sounds are normal. There is no distension.     Palpations: Abdomen is soft.     Tenderness: There is abdominal tenderness. There is no guarding.     Comments: LLQ tenderness  Genitourinary:    Comments: No cva tenderness. Musculoskeletal:        General: No swelling.     Cervical back: Normal range of motion and neck supple. No rigidity.  Skin:    General: Skin is warm and dry.     Findings: No rash.  Neurological:     Mental Status: He is alert.     Comments: Alert, speech clear. Motor/sens grossly intact. Steady gait.   Psychiatric:        Mood and Affect: Mood normal.     ED Results / Procedures / Treatments   Labs (all labs ordered are listed, but only abnormal results are displayed) Results for orders placed or performed during the hospital encounter of 08/30/23  Lipase, blood  Result Value Ref Range   Lipase 28 11 - 51 U/L  Comprehensive metabolic panel  Result Value Ref Range   Sodium 136 135 - 145 mmol/L   Potassium 4.0 3.5 - 5.1 mmol/L   Chloride 104 98 - 111 mmol/L   CO2 21 (L) 22 - 32 mmol/L   Glucose, Bld 98 70 - 99 mg/dL   BUN 17 8 - 23 mg/dL   Creatinine, Ser 1.32 0.61 - 1.24 mg/dL   Calcium 9.6 8.9 - 44.0 mg/dL   Total Protein 8.0 6.5 - 8.1 g/dL   Albumin 4.0 3.5 - 5.0 g/dL   AST 23 15 - 41 U/L   ALT 26 0 - 44 U/L   Alkaline Phosphatase 81 38 - 126 U/L   Total Bilirubin 1.2 0.3 - 1.2 mg/dL   GFR, Estimated >10 >27 mL/min   Anion gap 11 5 - 15  CBC  Result Value Ref Range   WBC 8.9 4.0 - 10.5 K/uL   RBC 4.87 4.22 - 5.81 MIL/uL   Hemoglobin 14.9 13.0 - 17.0 g/dL   HCT 25.3 66.4 - 40.3 %   MCV 92.2 80.0 - 100.0 fL   MCH 30.6 26.0 - 34.0 pg   MCHC 33.2 30.0 - 36.0 g/dL   RDW 47.4 25.9 - 56.3 %   Platelets 234 150 - 400 K/uL   nRBC 0.0 0.0 - 0.2 %  Urinalysis, Routine w reflex microscopic -Urine, Random  Result Value Ref Range   Color, Urine AMBER (A) YELLOW   APPearance CLEAR CLEAR   Specific Gravity, Urine 1.025  1.005 - 1.030   pH 5.0 5.0 - 8.0   Glucose, UA NEGATIVE NEGATIVE mg/dL  Hgb urine dipstick MODERATE (A) NEGATIVE   Bilirubin Urine NEGATIVE NEGATIVE   Ketones, ur NEGATIVE NEGATIVE mg/dL   Protein, ur NEGATIVE NEGATIVE mg/dL   Nitrite NEGATIVE NEGATIVE   Leukocytes,Ua NEGATIVE NEGATIVE   RBC / HPF 0-5 0 - 5 RBC/hpf   WBC, UA 0-5 0 - 5 WBC/hpf   Bacteria, UA RARE (A) NONE SEEN   Squamous Epithelial / HPF 0-5 0 - 5 /HPF   Mucus PRESENT     EKG None  Radiology CT ABDOMEN PELVIS W CONTRAST  Result Date: 08/30/2023 CLINICAL DATA:  Acute generalized abdominal pain. EXAM: CT ABDOMEN AND PELVIS WITH CONTRAST TECHNIQUE: Multidetector CT imaging of the abdomen and pelvis was performed using the standard protocol following bolus administration of intravenous contrast. RADIATION DOSE REDUCTION: This exam was performed according to the departmental dose-optimization program which includes automated exposure control, adjustment of the mA and/or kV according to patient size and/or use of iterative reconstruction technique. CONTRAST:  75mL OMNIPAQUE IOHEXOL 350 MG/ML SOLN COMPARISON:  July 05, 2023. FINDINGS: Lower chest: No acute abnormality. Hepatobiliary: No focal liver abnormality is seen. No gallstones, gallbladder wall thickening, or biliary dilatation. Pancreas: Unremarkable. No pancreatic ductal dilatation or surrounding inflammatory changes. Spleen: Normal in size without focal abnormality. Adrenals/Urinary Tract: Adrenal glands appear normal. Small nonobstructive right renal calculus is noted. No hydronephrosis or renal obstruction is noted. Urinary bladder is unremarkable. Stomach/Bowel: Stomach and appendix are unremarkable. There is no evidence of bowel obstruction. Focal diverticulitis is seen involving the proximal sigmoid colon without abscess formation. Vascular/Lymphatic: Aortic atherosclerosis. No enlarged abdominal or pelvic lymph nodes. Reproductive: Prostate is unremarkable. Other: No  abdominal wall hernia or abnormality. No abdominopelvic ascites. Musculoskeletal: No acute or significant osseous findings. IMPRESSION: Diverticulitis is seen involving proximal sigmoid colon without abscess formation. Small nonobstructive right renal calculus. Aortic Atherosclerosis (ICD10-I70.0). Electronically Signed   By: Lupita Raider M.D.   On: 08/30/2023 14:43    Procedures Procedures    Medications Ordered in ED Medications  morphine (PF) 4 MG/ML injection 4 mg (4 mg Intravenous Given 08/30/23 1115)  ondansetron (ZOFRAN) injection 4 mg (4 mg Intravenous Given 08/30/23 1118)  iohexol (OMNIPAQUE) 350 MG/ML injection 75 mL (75 mLs Intravenous Contrast Given 08/30/23 1151)    ED Course/ Medical Decision Making/ A&P                                 Medical Decision Making Problems Addressed: Acute diverticulitis: acute illness or injury with systemic symptoms that poses a threat to life or bodily functions Elevated blood pressure reading: acute illness or injury Essential hypertension: chronic illness or injury with exacerbation, progression, or side effects of treatment that poses a threat to life or bodily functions LLQ abdominal pain: acute illness or injury with systemic symptoms  Amount and/or Complexity of Data Reviewed External Data Reviewed: labs, radiology and notes. Labs: ordered. Decision-making details documented in ED Course. Radiology: ordered and independent interpretation performed. Decision-making details documented in ED Course.  Risk Prescription drug management. Parenteral controlled substances. Decision regarding hospitalization.   Iv ns. Continuous pulse ox and cardiac monitoring. Labs ordered/sent. Imaging ordered.   Differential diagnosis includes diverticulitis, pyelo, etc. Dispo decision including potential need for admission considered - will get labs and imaging and reassess.   Reviewed nursing notes and prior charts for additional history.  External reports reviewed.  Hx diverticula of colon/diverticulitis.   Morphine iv. Zofran iv.  Cardiac monitor: sinus rhythm, rate 88.  Labs reviewed/interpreted by me - wbc and hgb normal. Ua neg for uti.   CT reviewed/interpreted by me - +diverticulitis.   Confirmed nkda w pt. Augmentin po.   Pt tolerating po, and overall is well appearing, not toxic appearing. Mild LLQ pain/tenderness, controlled.   Pt appears stable for d/c.   Rec close pcp f/u.  Return precautions provided.          Final Clinical Impression(s) / ED Diagnoses Final diagnoses:  Acute diverticulitis  LLQ abdominal pain  Elevated blood pressure reading  Essential hypertension    Rx / DC Orders ED Discharge Orders     None         Cathren Laine, MD 08/30/23 1459

## 2023-08-30 NOTE — ED Triage Notes (Addendum)
Pt. Stated, I've had stomach pain mostly to left and all over. I feel like Im not able to empty my urione all the way and I will have diarrhea. My body hurts all over. This started 2 weeks ago. My eyes feel like they are losing their vision for 7 months

## 2023-10-20 ENCOUNTER — Other Ambulatory Visit: Payer: Self-pay

## 2023-10-20 ENCOUNTER — Emergency Department (HOSPITAL_COMMUNITY)
Admission: EM | Admit: 2023-10-20 | Discharge: 2023-10-20 | Disposition: A | Discharge status: Home / Self Care | Payer: BLUE CROSS/BLUE SHIELD | Attending: Emergency Medicine | Admitting: Emergency Medicine

## 2023-10-20 ENCOUNTER — Emergency Department (HOSPITAL_COMMUNITY): Payer: BLUE CROSS/BLUE SHIELD

## 2023-10-20 DIAGNOSIS — I10 Essential (primary) hypertension: Secondary | ICD-10-CM | POA: Insufficient documentation

## 2023-10-20 DIAGNOSIS — L03213 Periorbital cellulitis: Secondary | ICD-10-CM

## 2023-10-20 DIAGNOSIS — H5713 Ocular pain, bilateral: Secondary | ICD-10-CM | POA: Insufficient documentation

## 2023-10-20 DIAGNOSIS — I159 Secondary hypertension, unspecified: Secondary | ICD-10-CM | POA: Diagnosis not present

## 2023-10-20 LAB — CBC WITH DIFFERENTIAL/PLATELET
Abs Immature Granulocytes: 0.02 10*3/uL (ref 0.00–0.07)
Basophils Absolute: 0 10*3/uL (ref 0.0–0.1)
Basophils Relative: 0 %
Eosinophils Absolute: 0.1 10*3/uL (ref 0.0–0.5)
Eosinophils Relative: 2 %
HCT: 44.1 % (ref 39.0–52.0)
Hemoglobin: 14.8 g/dL (ref 13.0–17.0)
Immature Granulocytes: 0 %
Lymphocytes Relative: 35 %
Lymphs Abs: 2 10*3/uL (ref 0.7–4.0)
MCH: 31 pg (ref 26.0–34.0)
MCHC: 33.6 g/dL (ref 30.0–36.0)
MCV: 92.3 fL (ref 80.0–100.0)
Monocytes Absolute: 0.5 10*3/uL (ref 0.1–1.0)
Monocytes Relative: 8 %
Neutro Abs: 3 10*3/uL (ref 1.7–7.7)
Neutrophils Relative %: 55 %
Platelets: 264 10*3/uL (ref 150–400)
RBC: 4.78 MIL/uL (ref 4.22–5.81)
RDW: 14.8 % (ref 11.5–15.5)
WBC: 5.5 10*3/uL (ref 4.0–10.5)
nRBC: 0 % (ref 0.0–0.2)

## 2023-10-20 LAB — COMPREHENSIVE METABOLIC PANEL
ALT: 24 U/L (ref 0–44)
AST: 21 U/L (ref 15–41)
Albumin: 4 g/dL (ref 3.5–5.0)
Alkaline Phosphatase: 62 U/L (ref 38–126)
Anion gap: 9 (ref 5–15)
BUN: 16 mg/dL (ref 8–23)
CO2: 23 mmol/L (ref 22–32)
Calcium: 9.4 mg/dL (ref 8.9–10.3)
Chloride: 110 mmol/L (ref 98–111)
Creatinine, Ser: 0.98 mg/dL (ref 0.61–1.24)
GFR, Estimated: >60 mL/min (ref >60)
Glucose, Bld: 92 mg/dL (ref 70–99)
Potassium: 3.8 mmol/L (ref 3.5–5.1)
Sodium: 142 mmol/L (ref 135–145)
Total Bilirubin: 1 mg/dL (ref <1.2)
Total Protein: 7.4 g/dL (ref 6.5–8.1)

## 2023-10-20 LAB — TROPONIN I (HIGH SENSITIVITY): Troponin I (High Sensitivity): 3 ng/L (ref <18)

## 2023-10-20 LAB — C-REACTIVE PROTEIN: CRP: 0.6 mg/dL (ref <1.0)

## 2023-10-20 LAB — SEDIMENTATION RATE: Sed Rate: 2 mm/h (ref 0–16)

## 2023-10-20 MED ORDER — HYDROCHLOROTHIAZIDE 25 MG PO TABS: 25.0000 mg | ORAL_TABLET | Freq: Every day | ORAL | 2 refills | Status: DC

## 2023-10-20 MED ORDER — AMLODIPINE BESYLATE 5 MG PO TABS
5.0000 mg | ORAL_TABLET | Freq: Once | ORAL | Status: AC
  Administered 2023-10-20: 5 mg via ORAL
  Filled 2023-10-20: qty 1

## 2023-10-20 MED ORDER — HYDROCHLOROTHIAZIDE 25 MG PO TABS: 12.5000 mg | ORAL_TABLET | Freq: Every day | ORAL | 2 refills | Status: DC

## 2023-10-20 MED ORDER — IOHEXOL 350 MG/ML SOLN
75.0000 mL | Freq: Once | INTRAVENOUS | Status: AC | PRN
  Administered 2023-10-20: 75 mL via INTRAVENOUS

## 2023-10-20 MED ORDER — HYDROCHLOROTHIAZIDE 25 MG PO TABS
25.0000 mg | ORAL_TABLET | ORAL | Status: AC
  Administered 2023-10-20: 25 mg via ORAL
  Filled 2023-10-20: qty 1

## 2023-10-20 MED ORDER — CEFDINIR 300 MG PO CAPS: 300.0000 mg | ORAL_CAPSULE | Freq: Two times a day (BID) | ORAL | 0 refills | Status: AC

## 2023-10-20 MED ORDER — TETRACAINE HCL 0.5 % OP SOLN
2.0000 [drp] | Freq: Once | OPHTHALMIC | Status: AC
  Administered 2023-10-20: 2 [drp] via OPHTHALMIC
  Filled 2023-10-20: qty 4

## 2023-10-20 MED ORDER — CLINDAMYCIN HCL 150 MG PO CAPS
300.0000 mg | ORAL_CAPSULE | ORAL | Status: AC
  Administered 2023-10-20: 300 mg via ORAL
  Filled 2023-10-20: qty 2

## 2023-10-20 MED ORDER — AMLODIPINE BESYLATE 10 MG PO TABS: 10.0000 mg | ORAL_TABLET | Freq: Every day | ORAL | 0 refills | Status: DC

## 2023-10-20 MED ORDER — CLINDAMYCIN HCL 300 MG PO CAPS: 300.0000 mg | ORAL_CAPSULE | Freq: Three times a day (TID) | ORAL | 0 refills | Status: AC

## 2023-10-20 MED ORDER — LABETALOL HCL 5 MG/ML IV SOLN
10.0000 mg | Freq: Once | INTRAVENOUS | Status: AC
  Administered 2023-10-20: 10 mg via INTRAVENOUS
  Filled 2023-10-20: qty 4

## 2023-10-20 MED ORDER — AMOXICILLIN-POT CLAVULANATE 875-125 MG PO TABS
1.0000 | ORAL_TABLET | ORAL | Status: AC
  Administered 2023-10-20: 1 via ORAL
  Filled 2023-10-20: qty 1

## 2023-10-20 MED ORDER — FLUORESCEIN SODIUM 1 MG OP STRP
1.0000 | ORAL_STRIP | Freq: Once | OPHTHALMIC | Status: AC
  Administered 2023-10-20: 1 via OPHTHALMIC
  Filled 2023-10-20: qty 1

## 2023-10-20 NOTE — ED Triage Notes (Addendum)
Pt. Stated, yesterday my eyes started hurting and when I woke up this morning they were swollen. Sometimes they are cloudy. Pt's eye lids are swollen , more on the left.

## 2023-10-20 NOTE — ED Provider Notes (Signed)
[place] EMERGENCY DEPARTMENT AT [facility] Provider Note   CSN: [id number] Arrival date & time: 10/20/23  0913     History  Chief Complaint  Patient presents with   Eye Problem   Eye Pain    Sten Basch is a 62 y.o. male.  With a history of hypertension hyperlipidemia presents to the ED for eye pain.  Over the last week patient notes dry cough and nasal congestion as well as rhinorrhea.  Beginning 2 days ago he had swelling in his left eyelid and drainage that has persisted since the onset.  Today the right eye became swollen as well.  Swelling has progressed and has become more painful.  No significant changes in vision.  Denies fevers, chills, trauma to the eye and recent injury.   Eye Problem Eye Pain       Home Medications Prior to Admission medications   Medication Sig Start Date End Date Taking? Authorizing Provider  acetaminophen (TYLENOL) 500 MG tablet Take 2 tablets (1,000 mg total) by mouth every 6 (six) hours as needed. 02/27/23  Yes Glyn Ade, MD  nitroGLYCERIN (NITROSTAT) 0.4 MG SL tablet Place 1 tablet (0.4 mg total) under the tongue every 5 (five) minutes as needed for chest pain. Max of 3 doses 05/31/17  Yes Julieanne Manson, MD  amLODipine (NORVASC) 10 MG tablet Take 1 tablet (10 mg total) by mouth daily. Patient not taking: Reported on 10/20/2023 07/05/23   Cathren Laine, MD  metoprolol tartrate (LOPRESSOR) 25 MG tablet Take 1 tablet (25 mg total) by mouth 2 (two) times daily. Patient not taking: Reported on 10/20/2023 07/05/23   Cathren Laine, MD      Allergies    Patient has no known allergies.    Review of Systems   Review of Systems  Eyes:  Positive for pain.    Physical Exam Updated Vital Signs BP (!) 179/120   Pulse 68   Temp 98.4 F (36.9 C) (Oral)   Resp 18   SpO2 98%  Physical Exam Vitals and nursing note reviewed.  HENT:     Head: Normocephalic and atraumatic.  Eyes:     General: Lids are everted, no  foreign bodies appreciated. Vision grossly intact. No visual field deficit or scleral icterus.       Left eye: Discharge present.    Intraocular pressure: Right eye pressure is 18 mmHg. Left eye pressure is 19 mmHg. Measurements were taken using a handheld tonometer.    Extraocular Movements: Extraocular movements intact.     Conjunctiva/sclera:     Right eye: Chemosis present.     Left eye: Chemosis and exudate present.     Pupils: Pupils are equal, round, and reactive to light.     Comments: No fluorescein uptake in either eye Pupils equal round reactive to light Extraocular movements intact No disconjugate gaze  Cardiovascular:     Rate and Rhythm: Normal rate and regular rhythm.  Pulmonary:     Effort: Pulmonary effort is normal.     Breath sounds: Normal breath sounds.  Abdominal:     Palpations: Abdomen is soft.     Tenderness: There is no abdominal tenderness.  Skin:    General: Skin is warm and dry.  Neurological:     Mental Status: He is alert.  Psychiatric:        Mood and Affect: Mood normal.     ED Results / Procedures / Treatments   Labs (all labs ordered are listed, but only  abnormal results are displayed) Labs Reviewed  COMPREHENSIVE METABOLIC PANEL  CBC WITH DIFFERENTIAL/PLATELET  C-REACTIVE PROTEIN  SEDIMENTATION RATE  TROPONIN I (HIGH SENSITIVITY)  TROPONIN I (HIGH SENSITIVITY)    EKG EKG Interpretation Date/Time:  Friday October 20 2023 15:38:30 EST Ventricular Rate:  69 PR Interval:  149 QRS Duration:  83 QT Interval:  366 QTC Calculation: 392 R Axis:   8  Text Interpretation: Sinus rhythm Confirmed by Vonita Moss [phone] on 10/20/2023 4:16:35 PM  Radiology No results found.  Procedures Procedures    Medications Ordered in ED Medications  fluorescein ophthalmic strip 1 strip (1 strip Both Eyes Given 10/20/23 1514)  tetracaine (PONTOCAINE) 0.5 % ophthalmic solution 2 drop (2 drops Both Eyes Given 10/20/23 1515)  labetalol  (NORMODYNE) injection 10 mg (10 mg Intravenous Given 10/20/23 1514)    ED Course/ Medical Decision Making/ A&P Clinical Course as of 10/20/23 1639  Fri Oct 20, 2023  1555 I, Estelle June DO, am transitioning care of this patient to the oncoming provider pending laboratory workup, CT, reevaluation and disposition [MP]  1600 Assumed care from Dr Elayne Snare. 61 yo M with hx of HTN but has been off his meds for months. Has sinus congestion and rhinorrhea. Now has BL eye pain and swelling. Eyes have normal IOP. Fluorescein stain normal. Concerned for infectious etiology such as preseptal cellulitis vs orbital cellulitis. Getting CT orbits at this time. Has labs sent for [facility] as well but no sxs.  [RP]    Clinical Course User Index [MP] Royanne Foots, DO [RP] Rondel Baton, MD                                 Medical Decision Making 62 year old male with history as above presenting for bilateral eye pain.  Preceding symptoms of nasal congestion rhinorrhea over the last week.  2 days of bilateral eye pain with swelling left greater than right.  No significant changes in his vision.  No recent trauma or injury to the eye.  External eye exam notable for lid edema and ecchymosis as well as some white discharge from the left eye.  No fluorescein uptake with Woods lamp exam.  Intraocular pressures on the high end of normal but both below 20 (OD 18, O OS 19).  Examination concerning for painful eye movements of the left eye with extraocular movements being tested.  Differential diagnosis includes preorbital versus orbital cellulitis, bacterial versus viral conjunctivitis, acute angle-closure glaucoma  Acute angle-closure glaucoma would be less likely in this case considering involvement of both eyes and gradual progression of swelling with proceeding nasal congestion and rhinorrhea.  No significant vision loss with intraocular pressures within normal limits.  Will obtain laboratory workup in addition to  to CT orbits to better evaluate.  Patient was definitely hypertensive in triage.  I have ordered a dose of labetalol 10 mg to treat his hypertension acutely  Amount and/or Complexity of Data Reviewed Labs: ordered. Radiology: ordered.  Risk Prescription drug management.           Final Clinical Impression(s) / ED Diagnoses Final diagnoses:  Secondary hypertension  Pain of both eyes    Rx / DC Orders ED Discharge Orders     None         Royanne Foots, DO 10/20/23 1639

## 2023-10-20 NOTE — ED Provider Notes (Signed)
  Physical Exam  BP (!) 192/125   Pulse 67   Temp 98.4 F (36.9 C) (Oral)   Resp 17   SpO2 96%   Physical Exam  Procedures  Procedures  ED Course / MDM   Clinical Course as of 10/20/23 1859  Fri Oct 20, 2023  1555 I, Estelle June DO, am transitioning care of this patient to the oncoming provider pending laboratory workup, CT, reevaluation and disposition [MP]  1600 Assumed care from Dr Elayne Snare. 62 yo M with hx of HTN but has been off his meds for months. Has sinus congestion and rhinorrhea. Now has BL eye pain and swelling. Eyes have normal IOP. Fluorescein stain normal. Concerned for infectious etiology such as preseptal cellulitis vs orbital cellulitis. Getting CT orbits at this time. Has labs sent [facility] as well but no sxs.  [RP]  1859 CT has returned and shows preseptal cellulitis.  Patient still hypertensive.  Given a dose of hydrochlorothiazide.  Will start him on this as an outpatient along with his amlodipine.  Was also given a dose of antibiotics in the emergency department and prescribed these as well for his preseptal cellulitis.  Will have him follow with his primary doctor in 2 to 3 days. [RP]    Clinical Course User Index [MP] Royanne Foots, DO [RP] Rondel Baton, MD   Medical Decision Making Amount and/or Complexity of Data Reviewed Labs: ordered. Radiology: ordered.  Risk Prescription drug management.      Rondel Baton, MD 10/20/23 [phone]

## 2023-10-20 NOTE — Discharge Instructions (Signed)
You were seen for your eye infection (preseptal cellulitis) in the emergency department.  Your blood pressure was also very elevated.  At home, please take the antibiotics we prescribed you.  Check your blood pressure daily.  Take your amlodipine and the hydrochlorothiazide you are prescribed.    Check your MyChart online for the results of any tests that had not resulted by the time you left the emergency department.   Follow-up with your primary doctor in 2-3 days regarding your visit.    Return immediately to the emergency department if you experience any of the following: Vision changes, or any other concerning symptoms.    Thank you for visiting our Emergency Department. It was a pleasure taking care of you today.

## 2023-12-12 ENCOUNTER — Encounter (HOSPITAL_COMMUNITY): Payer: Self-pay

## 2023-12-12 ENCOUNTER — Ambulatory Visit (HOSPITAL_COMMUNITY)
Admission: EM | Admit: 2023-12-12 | Discharge: 2023-12-12 | Disposition: A | Discharge status: Home / Self Care | Payer: BC Managed Care – PPO | Attending: Family Medicine | Admitting: Family Medicine

## 2023-12-12 DIAGNOSIS — J069 Acute upper respiratory infection, unspecified: Secondary | ICD-10-CM | POA: Diagnosis not present

## 2023-12-12 LAB — POCT URINALYSIS DIP (MANUAL ENTRY)
Bilirubin, UA: NEGATIVE
Glucose, UA: NEGATIVE mg/dL
Ketones, POC UA: NEGATIVE mg/dL
Leukocytes, UA: NEGATIVE
Nitrite, UA: NEGATIVE
Protein Ur, POC: 30 mg/dL — AB
Spec Grav, UA: 1.025 (ref 1.010–1.025)
Urobilinogen, UA: 0.2 U/dL
pH, UA: 5.5 (ref 5.0–8.0)

## 2023-12-12 LAB — POCT INFLUENZA A/B
Influenza A, POC: NEGATIVE
Influenza B, POC: NEGATIVE

## 2023-12-12 NOTE — ED Triage Notes (Signed)
Pt c/o loss of appetite, nausea, cough, rib cage pain with coughing, urinary frequency, body aches, headaches, confusion, and an inability so sleep.   Start date: 2/2/202  Home Interventions: Nyquil cold and flu

## 2023-12-12 NOTE — Discharge Instructions (Addendum)
 Your flu testing was negative.   Based on your described symptoms and the duration of symptoms it is likely that you have a viral upper respiratory infection (often called a cold)  Symptoms can last for 3-10 days with lingering cough and intermittent symptoms lasting weeks after that.  The goal of treatment at this time is to reduce your symptoms and discomfort   I recommend using Robitussin and Mucinex  (regular formulations, nothing with decongestants or DM)  You can also use Tylenol  for body aches and fever reduction I also recommend adding an antihistamine to your daily regimen This includes medications like Claritin, Allegra, Zyrtec- the generics of these work very well and are usually less expensive I recommend using Flonase nasal spray - 2 puffs twice per day to help with your nasal congestion The antihistamines and Flonase can take a few weeks to provide significant relief from allergy symptoms but should start to provide some benefit soon. You can use a humidifier at night to help with preventing nasal dryness and irritation   If your symptoms do not improve or become worse in the next 5-7 days please make an apt at the office so we can see you  Go to the ER if you begin to have more serious symptoms such as shortness of breath, trouble breathing, loss of consciousness, swelling around the eyes, high fever, severe lasting headaches, vision changes or neck pain/stiffness.

## 2023-12-12 NOTE — ED Provider Notes (Signed)
 [facility]    CSN: [id number] Arrival date & time: 12/12/23  1215      History   Chief Complaint Chief Complaint  Patient presents with   Cough   Urinary Frequency    HPI Bradley [name] is a 63 y.o. male.   HPI   He states he has been feeling ill since Sun He reports he has had cough, headaches, appetite changes,  He reports he is having more frequent bowel movements  He reports chest tightness and pains at rest- states it resolves with stretching and moving arms   Interventions: has been taking Alkaseltzer cold and flu   Past Medical History:  Diagnosis Date   Arthritis    lower back by by butt; right shoulder (12/10/2014)   Chronic back pain    Chronic sinusitis    Depression 05/31/2017   Eczema of both hands    Sometimes nickel allergy infraumbilical, flexural as well at times   Hearing loss    Hypertension 2015   Kidney stones Teen   Lithotripsy he believes on the right   Sciatica    Seasonal allergies    Tobacco abuse     Patient Active Problem List   Diagnosis Date Noted   Depression 05/31/2017   Kidney stones    Eczema of both hands    Essential hypertension 09/06/2016   Eczema 09/06/2016   Pain in the chest    Hypertensive urgency 12/10/2014   Dizziness 12/10/2014   Chest pain 12/10/2014   BENIGN NEOPLASM OTHER&UNSPECIFIED PARTS MOUTH 11/11/2009   ABSCESS, AXILLA, RIGHT 11/11/2009   DERMATITIS, ALLERGIC 11/11/2009    Past Surgical History:  Procedure Laterality Date   INCISION AND DRAINAGE ABSCESS     boils; under right arm, hip, side   KIDNEY STONE SURGERY  ~ 1980   Lithotripsy to right, he believes   LEFT HEART CATHETERIZATION WITH CORONARY ANGIOGRAM N/A 12/12/2014   Procedure: LEFT HEART CATHETERIZATION WITH CORONARY ANGIOGRAM;  Surgeon: Lonni JONETTA Cash, MD;  Location[facility] CATH LAB;  Service: Cardiovascular;  Laterality: N/A;       Home Medications    Prior to Admission medications   Medication Sig Start Date  End Date Taking? Authorizing Provider  hydrochlorothiazide  (HYDRODIURIL ) 25 MG tablet Take 0.5 tablets (12.5 mg total) by mouth daily. 10/20/23  Yes Yolande Lamar BROCKS, MD  acetaminophen  (TYLENOL ) 500 MG tablet Take 2 tablets (1,000 mg total) by mouth every 6 (six) hours as needed. 02/27/23   Jerral Meth, MD  amLODipine  (NORVASC ) 10 MG tablet Take 1 tablet (10 mg total) by mouth daily. 10/20/23   Yolande Lamar BROCKS, MD  metoprolol  tartrate (LOPRESSOR ) 25 MG tablet Take 1 tablet (25 mg total) by mouth 2 (two) times daily. Patient not taking: Reported on 10/20/2023 07/05/23   Bernard Drivers, MD  nitroGLYCERIN  (NITROSTAT ) 0.4 MG SL tablet Place 1 tablet (0.4 mg total) under the tongue every 5 (five) minutes as needed for chest pain. Max of 3 doses 05/31/17   Adella Norris, MD    Family History Family History  Problem Relation Age of Onset   Coronary artery disease Father    Hodgkin's lymphoma Father        Hodgkin's Lymphoma--cause of death   Coronary artery disease Mother        Died of MI   Diabetes Mother    Hypertension Mother    Hyperlipidemia Mother    Coronary artery disease Sister 5       CABG  Diabetes Sister    Hypertension Sister    Hyperlipidemia Sister    Prostate cancer Brother 9   Seizures Son        Father describes absence seizures   Diabetes Sister    Hypertension Sister    Hyperlipidemia Sister    Coronary artery disease Brother        Died following complication following CABG   Hypertension Brother     Social History Social History   Tobacco Use   Smoking status: Every Day    Current packs/day: 0.10    Average packs/day: 0.1 packs/day for 44.0 years (4.4 ttl pk-yrs)    Types: Cigarettes   Smokeless tobacco: Never   Tobacco comments:    Has not tried anything but cold turkey  Vaping Use   Vaping status: Never Used  Substance Use Topics   Alcohol use: Yes    Comment: beer occasional   Drug use: Yes    Types: Marijuana     Allergies    Patient has no known allergies.   Review of Systems Review of Systems  Constitutional:  Positive for appetite change, chills, fatigue and fever.  HENT:  Negative for congestion, rhinorrhea, sinus pressure, sinus pain and sore throat.   Respiratory:  Positive for cough and chest tightness.   Gastrointestinal:  Positive for diarrhea. Negative for nausea and vomiting.  Musculoskeletal:  Negative for myalgias.  Neurological:  Positive for light-headedness and headaches.     Physical Exam Triage Vital Signs ED Triage Vitals  Encounter Vitals Group     BP 12/12/23 1344 (!) 137/91     Systolic BP Percentile --      Diastolic BP Percentile --      Pulse Rate 12/12/23 1344 72     Resp 12/12/23 1344 18     Temp 12/12/23 1344 98.6 F (37 C)     Temp Source 12/12/23 1344 Oral     SpO2 12/12/23 1344 97 %     Weight --      Height --      Head Circumference --      Peak Flow --      Pain Score 12/12/23 1340 0     Pain Loc --      Pain Education --      Exclude from Growth Chart --    No data found.  Updated Vital Signs BP (!) 137/91 (BP Location: Right Arm)   Pulse 72   Temp 98.6 F (37 C) (Oral)   Resp 18   SpO2 97%   Visual Acuity Right Eye Distance:   Left Eye Distance:   Bilateral Distance:    Right Eye Near:   Left Eye Near:    Bilateral Near:     Physical Exam Vitals reviewed.  Constitutional:      General: He is awake.     Appearance: Normal appearance. He is well-developed and well-groomed.  HENT:     Head: Normocephalic and atraumatic.     Right Ear: Hearing, tympanic membrane and ear canal normal.     Left Ear: Hearing, tympanic membrane and ear canal normal.     Mouth/Throat:     Lips: Pink.     Mouth: Mucous membranes are moist.     Pharynx: Oropharynx is clear. Uvula midline. No pharyngeal swelling, oropharyngeal exudate, posterior oropharyngeal erythema, uvula swelling or postnasal drip.     Tonsils: No tonsillar exudate or tonsillar abscesses.   Eyes:     General: Lids  are normal. Gaze aligned appropriately.     Extraocular Movements: Extraocular movements intact.  Cardiovascular:     Rate and Rhythm: Normal rate and regular rhythm.     Heart sounds: Normal heart sounds.  Pulmonary:     Effort: Pulmonary effort is normal.     Breath sounds: Normal breath sounds. No decreased air movement. No decreased breath sounds, wheezing, rhonchi or rales.  Musculoskeletal:     Cervical back: Normal range of motion and neck supple.  Lymphadenopathy:     Head:     Right side of head: No submental, submandibular or preauricular adenopathy.     Left side of head: No submental, submandibular or preauricular adenopathy.     Cervical:     Right cervical: No superficial cervical adenopathy.    Left cervical: No superficial cervical adenopathy.     Upper Body:     Right upper body: No supraclavicular adenopathy.     Left upper body: No supraclavicular adenopathy.  Skin:    General: Skin is warm and dry.  Neurological:     General: No focal deficit present.     Mental Status: He is alert and oriented to person, place, and time.  Psychiatric:        Mood and Affect: Mood normal.        Behavior: Behavior normal. Behavior is cooperative.        Thought Content: Thought content normal.        Judgment: Judgment normal.      UC Treatments / Results  Labs (all labs ordered are listed, but only abnormal results are displayed) Labs Reviewed  POCT URINALYSIS DIP (MANUAL ENTRY) - Abnormal; Notable for the following components:      Result Value   Blood, UA small (*)    Protein Ur, POC =30 (*)    All other components within normal limits  POCT INFLUENZA A/B    EKG   Radiology No results found.  Procedures Procedures (including critical care time)  Medications Ordered in UC Medications - No data to display  Initial Impression / Assessment and Plan / UC Course  I have reviewed the triage vital signs and the nursing  notes.  Pertinent labs & imaging results that were available during my care of the patient were reviewed by me and considered in my medical decision making (see chart for details).      Final Clinical Impressions(s) / UC Diagnoses   Final diagnoses:  Viral upper respiratory tract infection   Acute, new concern Patient reports that he has been having coughing with chest pain, nausea, body aches, headaches and reduced ability to sleep. Flu testing was negative today.  Results were discussed with patient at the end of appointment. At this time I recommend symptomatic management with over-the-counter medications such as Robitussin, Mucinex , Tylenol . Reviewed ED and return precautions  Of note urine was positive for small amount of blood.  Negative for leukocytes or nitrites though less suspicious for UTI.  Recommend follow-up with PCP for further evaluation of hematuria and increased frequency     Discharge Instructions      Your flu testing was negative.   Based on your described symptoms and the duration of symptoms it is likely that you have a viral upper respiratory infection (often called a cold)  Symptoms can last for 3-10 days with lingering cough and intermittent symptoms lasting weeks after that.  The goal of treatment at this time is to reduce your symptoms and discomfort  I recommend using Robitussin and Mucinex  (regular formulations, nothing with decongestants or DM)  You can also use Tylenol  for body aches and fever reduction I also recommend adding an antihistamine to your daily regimen This includes medications like Claritin, Allegra, Zyrtec- the generics of these work very well and are usually less expensive I recommend using Flonase nasal spray - 2 puffs twice per day to help with your nasal congestion The antihistamines and Flonase can take a few weeks to provide significant relief from allergy symptoms but should start to provide some benefit soon. You can  use a humidifier at night to help with preventing nasal dryness and irritation   If your symptoms do not improve or become worse in the next 5-7 days please make an apt at the office so we can see you  Go to the ER if you begin to have more serious symptoms such as shortness of breath, trouble breathing, loss of consciousness, swelling around the eyes, high fever, severe lasting headaches, vision changes or neck pain/stiffness.       ED Prescriptions   None    PDMP not reviewed this encounter.   Marylene Rocky BRAVO, PA-C 12/12/23 1412

## 2024-01-03 ENCOUNTER — Other Ambulatory Visit: Payer: Self-pay

## 2024-01-03 ENCOUNTER — Encounter (HOSPITAL_COMMUNITY): Payer: Self-pay

## 2024-01-03 ENCOUNTER — Emergency Department (HOSPITAL_COMMUNITY)
Admission: EM | Admit: 2024-01-03 | Discharge: 2024-01-04 | Discharge status: Against Medical Advice | Payer: BC Managed Care – PPO

## 2024-01-03 DIAGNOSIS — Z5321 Procedure and treatment not carried out due to patient leaving prior to being seen by health care provider: Secondary | ICD-10-CM | POA: Insufficient documentation

## 2024-01-03 DIAGNOSIS — R109 Unspecified abdominal pain: Secondary | ICD-10-CM | POA: Diagnosis present

## 2024-01-03 DIAGNOSIS — R197 Diarrhea, unspecified: Secondary | ICD-10-CM | POA: Diagnosis not present

## 2024-01-03 DIAGNOSIS — R112 Nausea with vomiting, unspecified: Secondary | ICD-10-CM | POA: Diagnosis not present

## 2024-01-03 LAB — CBC
HCT: 44 % (ref 39.0–52.0)
Hemoglobin: 14.6 g/dL (ref 13.0–17.0)
MCH: 30.9 pg (ref 26.0–34.0)
MCHC: 33.2 g/dL (ref 30.0–36.0)
MCV: 93.2 fL (ref 80.0–100.0)
Platelets: 290 10*3/uL (ref 150–400)
RBC: 4.72 MIL/uL (ref 4.22–5.81)
RDW: 14.8 % (ref 11.5–15.5)
WBC: 11.8 10*3/uL — ABNORMAL HIGH (ref 4.0–10.5)
nRBC: 0 % (ref 0.0–0.2)

## 2024-01-03 LAB — COMPREHENSIVE METABOLIC PANEL
ALT: 17 U/L (ref 0–44)
AST: 16 U/L (ref 15–41)
Albumin: 3.9 g/dL (ref 3.5–5.0)
Alkaline Phosphatase: 68 U/L (ref 38–126)
Anion gap: 11 (ref 5–15)
BUN: 12 mg/dL (ref 8–23)
CO2: 23 mmol/L (ref 22–32)
Calcium: 9.5 mg/dL (ref 8.9–10.3)
Chloride: 106 mmol/L (ref 98–111)
Creatinine, Ser: 0.93 mg/dL (ref 0.61–1.24)
GFR, Estimated: >60 mL/min (ref >60)
Glucose, Bld: 106 mg/dL — ABNORMAL HIGH (ref 70–99)
Potassium: 4.1 mmol/L (ref 3.5–5.1)
Sodium: 140 mmol/L (ref 135–145)
Total Bilirubin: 1.2 mg/dL (ref 0.0–1.2)
Total Protein: 7.5 g/dL (ref 6.5–8.1)

## 2024-01-03 LAB — LIPASE, BLOOD: Lipase: 25 U/L (ref 11–51)

## 2024-01-03 NOTE — ED Triage Notes (Signed)
 Pt here for left sided abd pain/ flank pain that started day before yesterday. C/O n/v/d.

## 2024-03-27 ENCOUNTER — Other Ambulatory Visit: Payer: Self-pay

## 2024-03-27 ENCOUNTER — Emergency Department (HOSPITAL_COMMUNITY)

## 2024-03-27 ENCOUNTER — Emergency Department (HOSPITAL_COMMUNITY)
Admission: EM | Admit: 2024-03-27 | Discharge: 2024-03-27 | Disposition: A | Discharge status: Home / Self Care | Attending: Emergency Medicine | Admitting: Emergency Medicine

## 2024-03-27 DIAGNOSIS — R079 Chest pain, unspecified: Secondary | ICD-10-CM

## 2024-03-27 DIAGNOSIS — I10 Essential (primary) hypertension: Secondary | ICD-10-CM

## 2024-03-27 DIAGNOSIS — H538 Other visual disturbances: Secondary | ICD-10-CM | POA: Insufficient documentation

## 2024-03-27 DIAGNOSIS — R202 Paresthesia of skin: Secondary | ICD-10-CM | POA: Insufficient documentation

## 2024-03-27 DIAGNOSIS — R0789 Other chest pain: Secondary | ICD-10-CM | POA: Insufficient documentation

## 2024-03-27 DIAGNOSIS — R1084 Generalized abdominal pain: Secondary | ICD-10-CM | POA: Diagnosis not present

## 2024-03-27 DIAGNOSIS — M79601 Pain in right arm: Secondary | ICD-10-CM | POA: Diagnosis not present

## 2024-03-27 LAB — URINALYSIS, ROUTINE W REFLEX MICROSCOPIC
Bacteria, UA: NONE SEEN
Bilirubin Urine: NEGATIVE
Glucose, UA: NEGATIVE mg/dL
Ketones, ur: NEGATIVE mg/dL
Leukocytes,Ua: NEGATIVE
Nitrite: NEGATIVE
Protein, ur: NEGATIVE mg/dL
Specific Gravity, Urine: 1.025 (ref 1.005–1.030)
pH: 6 (ref 5.0–8.0)

## 2024-03-27 LAB — COMPREHENSIVE METABOLIC PANEL WITH GFR
ALT: 27 U/L (ref 0–44)
AST: 20 U/L (ref 15–41)
Albumin: 3.6 g/dL (ref 3.5–5.0)
Alkaline Phosphatase: 67 U/L (ref 38–126)
Anion gap: 6 (ref 5–15)
BUN: 17 mg/dL (ref 8–23)
CO2: 23 mmol/L (ref 22–32)
Calcium: 9 mg/dL (ref 8.9–10.3)
Chloride: 110 mmol/L (ref 98–111)
Creatinine, Ser: 0.91 mg/dL (ref 0.61–1.24)
GFR, Estimated: >60 mL/min (ref >60)
Glucose, Bld: 97 mg/dL (ref 70–99)
Potassium: 3.7 mmol/L (ref 3.5–5.1)
Sodium: 139 mmol/L (ref 135–145)
Total Bilirubin: 0.4 mg/dL (ref 0.0–1.2)
Total Protein: 7.1 g/dL (ref 6.5–8.1)

## 2024-03-27 LAB — CBC
HCT: 44 % (ref 39.0–52.0)
Hemoglobin: 14.8 g/dL (ref 13.0–17.0)
MCH: 30.6 pg (ref 26.0–34.0)
MCHC: 33.6 g/dL (ref 30.0–36.0)
MCV: 90.9 fL (ref 80.0–100.0)
Platelets: 317 10*3/uL (ref 150–400)
RBC: 4.84 MIL/uL (ref 4.22–5.81)
RDW: 13.5 % (ref 11.5–15.5)
WBC: 5.1 10*3/uL (ref 4.0–10.5)
nRBC: 0 % (ref 0.0–0.2)

## 2024-03-27 LAB — TROPONIN I (HIGH SENSITIVITY)
Troponin I (High Sensitivity): 3 ng/L (ref <18)
Troponin I (High Sensitivity): 3 ng/L (ref <18)

## 2024-03-27 LAB — LIPASE, BLOOD: Lipase: 70 U/L — ABNORMAL HIGH (ref 11–51)

## 2024-03-27 MED ORDER — HYDROCHLOROTHIAZIDE 12.5 MG PO TABS: 12.5000 mg | ORAL_TABLET | Freq: Every day | ORAL | 3 refills | Status: DC

## 2024-03-27 MED ORDER — AMLODIPINE BESYLATE 10 MG PO TABS: 10.0000 mg | ORAL_TABLET | Freq: Every day | ORAL | 3 refills | Status: DC

## 2024-03-27 MED ORDER — KETOROLAC TROMETHAMINE 15 MG/ML IJ SOLN
15.0000 mg | Freq: Once | INTRAMUSCULAR | Status: AC
  Administered 2024-03-27: 15 mg via INTRAVENOUS
  Filled 2024-03-27: qty 1

## 2024-03-27 MED ORDER — LACTATED RINGERS IV BOLUS
1000.0000 mL | Freq: Once | INTRAVENOUS | Status: AC
  Administered 2024-03-27: 1000 mL via INTRAVENOUS

## 2024-03-27 MED ORDER — AMLODIPINE BESYLATE 5 MG PO TABS
10.0000 mg | ORAL_TABLET | Freq: Once | ORAL | Status: AC
  Administered 2024-03-27: 10 mg via ORAL
  Filled 2024-03-27: qty 2

## 2024-03-27 MED ORDER — DICLOFENAC SODIUM 1 % EX GEL: 4.0000 g | Freq: Four times a day (QID) | CUTANEOUS | 0 refills | Status: AC

## 2024-03-27 MED ORDER — HYDROCHLOROTHIAZIDE 12.5 MG PO TABS
12.5000 mg | ORAL_TABLET | Freq: Once | ORAL | Status: AC
  Administered 2024-03-27: 12.5 mg via ORAL
  Filled 2024-03-27: qty 1

## 2024-03-27 MED ORDER — IOHEXOL 350 MG/ML SOLN
75.0000 mL | Freq: Once | INTRAVENOUS | Status: AC | PRN
Start: 2024-03-27 — End: 2024-03-27
  Administered 2024-03-27: 75 mL via INTRAVENOUS

## 2024-03-27 NOTE — Discharge Instructions (Addendum)
 Your blood pressure is higher than it should be.  Please discuss this with your family doctor.  Your test here look like your pain is unlikely to be a heart attack.  Please return if it suddenly worsens if you are coughing up blood or if you pass out.  Use the gel as prescribed Also take tylenol  1000mg (2 extra strength) four times a day.

## 2024-03-27 NOTE — ED Provider Notes (Signed)
 Received patient in turnover from Dr. Aldean Amass.  Please see their note for further details of Hx, PE.  Briefly patient is a 63 y.o. male with a Chest Pain, Dizziness, Neck Pain, Blurred Vision, and Shortness of Breath .  Patient with multiple complaints.  2 troponins are negative.  Chest x-ray without obvious acute finding.  Plan for CT abdomen pelvis with contrast and reassessment.  Patient is feeling mildly better on repeat assessment.  Has been out of his home blood pressure medications for some time.  Will represcribe them for him today.  CT abdomen pelvis without obvious acute finding.  1 some discussion with the patient about his chest discomfort sounds most likely musculoskeletal.  Will have him treat supportively.  PCP follow-up.Inga Manges, Genella Kendall, DO 03/27/24 1659

## 2024-03-27 NOTE — ED Provider Triage Note (Signed)
 Emergency Medicine Provider Triage Evaluation Note  Bradley [name] , a 63 y.o. male  was evaluated in triage.  Pt complains of chest pain to the right side of his chest.  It has been present for several days.  He has been been having constant pain since at least Sunday.  He cannot tell me anything that makes it better or worse.  Review of Systems  Positive: Chest pain Negative: Vomiting dyspnea  Physical Exam  BP (!) 180/116 (BP Location: Right Arm)   Pulse 72   Temp 98.2 F (36.8 C) (Oral)   Resp 16   Ht 1.676 m (5\' 6" )   Wt 80.7 kg   SpO2 100%   BMI 28.73 kg/m  Gen:   Awake, no distress   Resp:  Normal effort  MSK:   Moves extremities without difficulty  Other:  Lungs are clear to auscultation  Medical Decision Making  Medically screening exam initiated at 9:42 AM.  Appropriate orders placed.  Bradley [name] was informed that the remainder of the evaluation will be completed by another provider, this initial triage assessment does not replace that evaluation, and the importance of remaining in the ED until their evaluation is complete.  Patient is hypertensive has not been taking his medication.  Plan entered CBC, c-Met, chest x-Bradley [name], troponins. EKG reviewed no evidence of ST elevation MI at this time   Bradley Blush, MD 03/27/24 [phone]

## 2024-03-27 NOTE — ED Provider Notes (Signed)
 [facility] Provider Note   CSN: [id number] Arrival date & time: 03/27/24  1914     History  Chief Complaint  Patient presents with   Chest Pain   Dizziness   Neck Pain   Blurred Vision   Shortness of Breath    Bradley [name] is a 63 y.o. male.  HPI 63 year old male presents with a chief complaint of chest pain.  For about a week he has been having right-sided chest pain and right arm pain.  Chest hurts worse when he moves his arm.  No weakness or numbness in his extremities besides some chronic tingling in his left thigh.  He has been out of his blood pressure medicine.  He denies headache but has had progressive blurry vision for months.  He also endorses 1-2 months of abdominal pain.  He states that he will have to go to the bathroom for a bowel movement whenever he eats but is not diarrhea.  Occasionally has some blood.  Sometimes he vomits but not often.  No cough.  No pleuritic pain. No leg swelling. No swelling to the right arm. No radiation of the chest pain into the back.  Home Medications Prior to Admission medications   Medication Sig Start Date End Date Taking? Authorizing Provider  acetaminophen  (TYLENOL ) 500 MG tablet Take 2 tablets (1,000 mg total) by mouth every 6 (six) hours as needed. 02/27/23   Onetha Bile, MD  amLODipine  (NORVASC ) 10 MG tablet Take 1 tablet (10 mg total) by mouth daily. 10/20/23   Ninetta Basket, MD  hydrochlorothiazide  (HYDRODIURIL ) 25 MG tablet Take 0.5 tablets (12.5 mg total) by mouth daily. 10/20/23   Ninetta Basket, MD  metoprolol  tartrate (LOPRESSOR ) 25 MG tablet Take 1 tablet (25 mg total) by mouth 2 (two) times daily. Patient not taking: Reported on 10/20/2023 07/05/23   Guadalupe Lee, MD  nitroGLYCERIN  (NITROSTAT ) 0.4 MG SL tablet Place 1 tablet (0.4 mg total) under the tongue every 5 (five) minutes as needed for chest pain. Max of 3 doses 05/31/17   Ronalee Cocking, MD       Allergies    Patient has no known allergies.    Review of Systems   Review of Systems  Respiratory:  Positive for shortness of breath. Negative for cough.   Cardiovascular:  Positive for chest pain. Negative for leg swelling.  Gastrointestinal:  Positive for abdominal pain. Negative for diarrhea.  Neurological:  Negative for weakness and headaches.    Physical Exam Updated Vital Signs BP (!) 206/134 (BP Location: Right Arm)   Pulse 68   Temp 98 F (36.7 C) (Oral)   Resp 18   Ht 5\' 6"  (1.676 m)   Wt 80.7 kg   SpO2 100%   BMI 28.73 kg/m  Physical Exam Vitals and nursing note reviewed.  Constitutional:      General: He is not in acute distress.    Appearance: He is well-developed. He is not ill-appearing or diaphoretic.  HENT:     Head: Normocephalic and atraumatic.  Cardiovascular:     Rate and Rhythm: Normal rate and regular rhythm.     Pulses:          Radial pulses are 2+ on the right side and 2+ on the left side.       Dorsalis pedis pulses are 2+ on the right side and 2+ on the left side.     Heart sounds: Normal heart sounds.  Pulmonary:  Effort: Pulmonary effort is normal.     Breath sounds: Normal breath sounds.  Chest:     Chest wall: Tenderness present.    Abdominal:     Palpations: Abdomen is soft.     Tenderness: There is abdominal tenderness (generalized).  Musculoskeletal:     Right shoulder: No tenderness. Normal range of motion.     Right upper arm: No swelling or tenderness.     Right lower leg: No tenderness. No edema.     Left lower leg: No tenderness. No edema.  Skin:    General: Skin is warm and dry.  Neurological:     Mental Status: He is alert.     Comments: 5/5 strength in all 4 extremities.     ED Results / Procedures / Treatments   Labs (all labs ordered are listed, but only abnormal results are displayed) Labs Reviewed  LIPASE, BLOOD - Abnormal; Notable for the following components:      Result Value   Lipase 70 (*)     All other components within normal limits  CBC  COMPREHENSIVE METABOLIC PANEL WITH GFR  URINALYSIS, ROUTINE W REFLEX MICROSCOPIC  TROPONIN I (HIGH SENSITIVITY)  TROPONIN I (HIGH SENSITIVITY)    EKG EKG Interpretation Date/Time:  Wednesday Mar 27 2024 09:27:09 EDT Ventricular Rate:  72 PR Interval:  136 QRS Duration:  74 QT Interval:  348 QTC Calculation: 381 R Axis:   20  Text Interpretation: Normal sinus rhythm Normal ECG When compared with ECG of 20-Oct-2023 15:38, PREVIOUS ECG IS PRESENT No significant change since last tracing Confirmed by Auston Blush [phone]) on 03/27/2024 9:48:08 AM  Radiology No results found.  Procedures Procedures    Medications Ordered in ED Medications  lactated ringers bolus 1,000 mL (1,000 mLs Intravenous New Bag/Given 03/27/24 1426)  ketorolac  (TORADOL ) 15 MG/ML injection 15 mg (15 mg Intravenous Given 03/27/24 1424)  amLODipine  (NORVASC ) tablet 10 mg (10 mg Oral Given 03/27/24 1416)  hydrochlorothiazide  (HYDRODIURIL ) tablet 12.5 mg (12.5 mg Oral Given 03/27/24 1416)  iohexol  (OMNIPAQUE ) 350 MG/ML injection 75 mL (75 mLs Intravenous Contrast Given 03/27/24 1455)    ED Course/ Medical Decision Making/ A&P                                 Medical Decision Making Amount and/or Complexity of Data Reviewed Labs: ordered.    Details: Normal troponin x 2.  Unremarkable creatinine. Radiology: ordered and independent interpretation performed.    Details: No CHF ECG/medicine tests: ordered and independent interpretation performed.    Details: No ischemia, unchanged from baseline.  Risk Prescription drug management.   Patient presents with what seems like chest wall pain.  Reproducible to the touch.  Will give a dose of Toradol .  ECG is unrevealing.  Troponins negative x 2.  He does have some diffuse abdominal pain which he has been having for over a month and so CT has been obtained given the tenderness and his age.  This is currently pending.  Will  also given his typical blood pressure medicines which he has not had recently.  Care transferred to Dr. Inga Manges.        Final Clinical Impression(s) / ED Diagnoses Final diagnoses:  None    Rx / DC Orders ED Discharge Orders     None         Jerilynn Montenegro, MD 03/27/24 [phone]

## 2024-03-27 NOTE — ED Triage Notes (Addendum)
 Pt. Stated, Bradley [name] had some chest pain for a week and my neck. When I stand up I get a little light headed. Im tired a lot. Another thing my vision has become bad in the last month. I just wear reading glasses.I do get SOB when I'm walking

## 2024-07-04 ENCOUNTER — Ambulatory Visit: Admitting: Physician Assistant

## 2024-07-04 ENCOUNTER — Other Ambulatory Visit: Payer: Self-pay | Admitting: Physician Assistant

## 2024-07-04 VITALS — BP 131/90 | HR 86 | Ht 66.0 in | Wt 178.0 lb

## 2024-07-04 DIAGNOSIS — I10 Essential (primary) hypertension: Secondary | ICD-10-CM

## 2024-07-04 DIAGNOSIS — F17210 Nicotine dependence, cigarettes, uncomplicated: Secondary | ICD-10-CM

## 2024-07-04 DIAGNOSIS — M25511 Pain in right shoulder: Secondary | ICD-10-CM

## 2024-07-04 NOTE — Progress Notes (Unsigned)
 New Patient Office Visit  Subjective    Patient ID: Bradley [name], male    DOB: 10/08/1961  Age: 63 y.o. MRN: [id number]  CC: No chief complaint on file.   Discussed the use of AI scribe software for clinical note transcription with the patient, who gave verbal consent to proceed.  History of Present Illness    [id number] Bradley [name]   131/90 --   BP Location Left Arm --  Patient Position Sitting --  Cuff Size Normal --  Pulse Rate 86 --  Weight 178 lb (80.7 kg) --  Height 5' 6 (1.676 m) --  SpO2 96 % --  Pain Score 7     HPI Bradley [name] presents to establish care ***  Outpatient Encounter Medications as of 07/04/2024  Medication Sig  . acetaminophen  (TYLENOL ) 500 MG tablet Take 2 tablets (1,000 mg total) by mouth every 6 (six) hours as needed.  . amLODipine  (NORVASC ) 10 MG tablet Take 1 tablet (10 mg total) by mouth daily.  . diclofenac  Sodium (VOLTAREN ) 1 % GEL Apply 4 g topically 4 (four) times daily.  . hydrochlorothiazide  (HYDRODIURIL ) 12.5 MG tablet Take 1 tablet (12.5 mg total) by mouth daily.  . metoprolol  tartrate (LOPRESSOR ) 25 MG tablet Take 1 tablet (25 mg total) by mouth 2 (two) times daily. (Patient not taking: Reported on 10/20/2023)  . nitroGLYCERIN  (NITROSTAT ) 0.4 MG SL tablet Place 1 tablet (0.4 mg total) under the tongue every 5 (five) minutes as needed for chest pain. Max of 3 doses   No facility-administered encounter medications on file as of 07/04/2024.    Past Medical History:  Diagnosis Date  . Arthritis    lower back by by butt; right shoulder (12/10/2014)  . Chronic back pain   . Chronic sinusitis   . Depression 05/31/2017  . Eczema of both hands    Sometimes nickel allergy infraumbilical, flexural as well at times  . Hearing loss   . Hypertension 2015  . Kidney stones Teen   Lithotripsy he believes on the right  . Sciatica   . Seasonal allergies   . Tobacco abuse     Past Surgical History:  Procedure  Laterality Date  . INCISION AND DRAINAGE ABSCESS     boils; under right arm, hip, side  . KIDNEY STONE SURGERY  ~ 1980   Lithotripsy to right, he believes  . LEFT HEART CATHETERIZATION WITH CORONARY ANGIOGRAM N/A 12/12/2014   Procedure: LEFT HEART CATHETERIZATION WITH CORONARY ANGIOGRAM;  Surgeon: Bradley JONETTA Cash, MD;  Location: [facility] CATH LAB;  Service: Cardiovascular;  Laterality: N/A;    Family History  Problem Relation Age of Onset  . Coronary artery disease Father   . Hodgkin's lymphoma Father        Hodgkin's Lymphoma--cause of death  . Coronary artery disease Mother        Died of MI  . Diabetes Mother   . Hypertension Mother   . Hyperlipidemia Mother   . Coronary artery disease Sister 11       CABG  . Diabetes Sister   . Hypertension Sister   . Hyperlipidemia Sister   . Prostate cancer Brother 53  . Seizures Son        Father describes absence seizures  . Diabetes Sister   . Hypertension Sister   . Hyperlipidemia Sister   . Coronary artery disease Brother        Died following complication following CABG  . Hypertension Brother  Social History   Socioeconomic History  . Marital status: Divorced    Spouse name: Not on file  . Number of children: 2  . Years of education: 41  . Highest education level: Not on file  Occupational History  . 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 Smoking status: Every Day    Current packs/day: 0.10    Average packs/day: 0.1 packs/day for 44.0 years (4.4 ttl pk-yrs)    Types: Cigarettes  . Smokeless tobacco: Never  . Tobacco comments:    Has not tried anything but cold [place]  Vaping Use  . Vaping status: Never Used  Substance and Sexual Activity  . Alcohol use: Yes    Comment: beer occasional  . Drug use: Yes    Types: Marijuana  . Sexual activity: Not Currently  Other Topics Concern  . Not on file  Social History Narrative   Born [place] raised in  [place]   Finished 11th grade at [employer]   Was living in [place], but now living in [place] around the corner   Kenneth, [place], living with him.   Daughter is 84 yo.  She lives in [place].  They are in touch.   Jonathon Jr. Is a 6th grader at [employer].  Working with Kelley Larve, LCSWA.   Social Drivers of Corporate investment banker Strain: Not on file  Food Insecurity: Not on file  Transportation Needs: Not on file  Physical Activity: Not on file  Stress: Not on file  Social Connections: Not on file  Intimate Partner Violence: Not on file    ROS      Objective    There were no vitals taken for this visit.  Physical Exam  {Labs (Optional):23779}    Assessment & Plan:   Problem List Items Addressed This Visit   None   No follow-ups on file.   Bradley RAMAN Mayers, PA-C

## 2024-07-05 ENCOUNTER — Encounter: Payer: Self-pay | Admitting: Physician Assistant

## 2024-07-05 ENCOUNTER — Other Ambulatory Visit: Payer: Self-pay | Admitting: Physician Assistant

## 2024-07-05 DIAGNOSIS — I10 Essential (primary) hypertension: Secondary | ICD-10-CM

## 2024-07-05 MED ORDER — METHYLPREDNISOLONE 4 MG PO TBPK
ORAL_TABLET | ORAL | 0 refills | Status: AC
Start: 2024-07-05 — End: ?

## 2024-07-05 MED ORDER — NAPROXEN 500 MG PO TABS
500.0000 mg | ORAL_TABLET | Freq: Two times a day (BID) | ORAL | 0 refills | Status: AC
Start: 2024-07-05 — End: ?

## 2024-07-05 MED ORDER — AMLODIPINE BESYLATE 10 MG PO TABS: 10.0000 mg | ORAL_TABLET | Freq: Every day | ORAL | 0 refills | Status: DC

## 2024-07-05 MED ORDER — HYDROCHLOROTHIAZIDE 12.5 MG PO TABS: 12.5000 mg | ORAL_TABLET | Freq: Every day | ORAL | 0 refills | Status: DC

## 2024-07-10 ENCOUNTER — Telehealth: Payer: Self-pay | Admitting: Physician Assistant

## 2024-07-10 NOTE — Telephone Encounter (Signed)
 Contacted pt to schedule follow up visit. Pt is scheduled 9/4

## 2024-07-11 ENCOUNTER — Ambulatory Visit: Payer: Self-pay | Admitting: Physician Assistant

## 2024-07-11 DIAGNOSIS — Z5320 Procedure and treatment not carried out because of patient's decision for unspecified reasons: Secondary | ICD-10-CM

## 2024-07-15 NOTE — Progress Notes (Signed)
 Patient ID: Bradley [name], male   DOB: [birth year], 63 y.o.   MRN: [id number]  No show for appt

## 2024-10-24 ENCOUNTER — Encounter: Payer: Self-pay | Admitting: Physician Assistant

## 2024-10-24 ENCOUNTER — Other Ambulatory Visit: Payer: Self-pay | Admitting: Physician Assistant

## 2024-10-24 ENCOUNTER — Ambulatory Visit: Admitting: Physician Assistant

## 2024-10-24 VITALS — BP 132/98 | HR 84 | Ht 66.0 in | Wt 178.0 lb

## 2024-10-24 DIAGNOSIS — Z125 Encounter for screening for malignant neoplasm of prostate: Secondary | ICD-10-CM

## 2024-10-24 DIAGNOSIS — F17201 Nicotine dependence, unspecified, in remission: Secondary | ICD-10-CM

## 2024-10-24 DIAGNOSIS — I10 Essential (primary) hypertension: Secondary | ICD-10-CM | POA: Diagnosis not present

## 2024-10-24 DIAGNOSIS — R0602 Shortness of breath: Secondary | ICD-10-CM

## 2024-10-24 DIAGNOSIS — J302 Other seasonal allergic rhinitis: Secondary | ICD-10-CM | POA: Diagnosis not present

## 2024-10-24 DIAGNOSIS — K760 Fatty (change of) liver, not elsewhere classified: Secondary | ICD-10-CM

## 2024-10-24 DIAGNOSIS — M25522 Pain in left elbow: Secondary | ICD-10-CM

## 2024-10-24 DIAGNOSIS — I700 Atherosclerosis of aorta: Secondary | ICD-10-CM

## 2024-10-24 DIAGNOSIS — Z13220 Encounter for screening for lipoid disorders: Secondary | ICD-10-CM

## 2024-10-24 DIAGNOSIS — F17200 Nicotine dependence, unspecified, uncomplicated: Secondary | ICD-10-CM | POA: Insufficient documentation

## 2024-10-24 MED ORDER — METHYLPREDNISOLONE ACETATE 80 MG/ML IJ SUSP
80.0000 mg | Freq: Once | INTRAMUSCULAR | Status: AC
  Administered 2024-10-24: 14:00:00 80 mg via INTRAMUSCULAR

## 2024-10-24 MED ORDER — NAPROXEN 500 MG PO TABS: 500.0000 mg | ORAL_TABLET | Freq: Two times a day (BID) | ORAL | 0 refills | Status: AC

## 2024-10-24 MED ORDER — HYDROCHLOROTHIAZIDE 12.5 MG PO TABS: 12.5000 mg | ORAL_TABLET | Freq: Every day | ORAL | 0 refills | Status: AC

## 2024-10-24 MED ORDER — AMLODIPINE BESYLATE 10 MG PO TABS: 10.0000 mg | ORAL_TABLET | Freq: Every day | ORAL | 0 refills | Status: AC

## 2024-10-24 MED ORDER — ALBUTEROL SULFATE HFA 108 (90 BASE) MCG/ACT IN AERS: 2.0000 | INHALATION_SPRAY | Freq: Four times a day (QID) | RESPIRATORY_TRACT | 0 refills | Status: AC | PRN

## 2024-10-24 MED ORDER — CETIRIZINE HCL 10 MG PO TABS: 10.0000 mg | ORAL_TABLET | Freq: Every day | ORAL | 11 refills | Status: AC

## 2024-10-24 NOTE — Patient Instructions (Addendum)
 We will call you with todays lab results.  You will return to the mobile unit in 2 weeks for follow up.   Kirk CANDIE Sage, PA-C Physician Assistant [facility] [email or web address]  Shortness of Breath, Adult Shortness of breath is when a person has trouble breathing or when a person feels like she or he is having trouble breathing in enough air. Shortness of breath could be a sign of a medical problem. Follow these instructions at home:  Pollutants Do not use any products that contain nicotine  or tobacco. These products include cigarettes, chewing tobacco, and vaping devices, such as e-cigarettes. This also includes cigars and pipes. If you need help quitting, ask your health care provider. Avoid things that can irritate your airways, including: Smoke. This includes campfire smoke, forest fire smoke, and secondhand smoke from tobacco products. Do not smoke or allow others to smoke in your home. Mold. Dust. Air pollution. Chemical fumes. Things that can give you an allergic reaction (allergens) if you have allergies. Common allergens include pollen from grasses or trees and animal dander. Keep your living space clean and free of mold and dust. General instructions Pay attention to any changes in your symptoms. Take over-the-counter and prescription medicines only as told by your health care provider. This includes oxygen therapy and inhaled medicines. Rest as needed. Return to your normal activities as told by your health care provider. Ask your health care provider what activities are safe for you. Keep all follow-up visits. This is important. Contact a health care provider if: Your condition does not improve as soon as expected. You have a hard time doing your normal activities, even after you rest. You have new symptoms. You cannot walk up stairs or exercise the way that you normally do. Get help right away if: Your shortness of  breath gets worse. You have shortness of breath when you are resting. You feel light-headed or you faint. You have a cough that is not controlled with medicines. You cough up blood. You have pain with breathing. You have pain in your chest, arms, shoulders, or abdomen. You have a fever. These symptoms may be an emergency. Get help right away. Call 911. Do not wait to see if the symptoms will go away. Do not drive yourself to the hospital. Summary Shortness of breath is when a person has trouble breathing enough air. It can be a sign of a medical problem. Avoid things that irritate your lungs, such as smoking, pollution, mold, and dust. Pay attention to changes in your symptoms and contact your health care provider if you have a hard time completing daily activities because of shortness of breath. This information is not intended to replace advice given to you by your health care provider. Make sure you discuss any questions you have with your health care provider. Document Revised: 06/12/2021 Document Reviewed: 06/12/2021 Elsevier Patient Education  2024 [employer].

## 2024-10-24 NOTE — Progress Notes (Unsigned)
 Established Patient Office Visit  Subjective   Patient ID: Bradley [name], male    DOB: Feb 13, 1961  Age: 63 y.o. MRN: [id number]  Chief Complaint  Patient presents with   Hypertension    He is worried about his BP, states has been having HA, blurry vision.  Discussed the use of AI scribe software for clinical note transcription with the patient, who gave verbal consent to proceed.  History of Present Illness   States that he has not been taking his blood pressure medication for the last 4 months due to not having any available.  States that he does not check his blood pressure at home, but has been complaining recently of having headaches and blurry vision but is unsure if that is related to his blood pressure.  States that the headaches and blurry vision have been ongoing for the past 2 months.  States that he drinks approximately 1 gallon of water a day.  States that he has not had his eyes checked in several years.  States that he has been having left elbow pain with swelling on and off for the past 2 months.  States that he had no injury and cannot remember trauma to his elbow.  States that he has tried over-the-counter arthritis creams without relief.  States that he has noticed more frequent shortness of breath, states that this happens while walking and sometimes while sitting.  States that he started smoking cigarettes when he was 63 years old, and approximately 6 months ago stopped smoking.  States that he will still occasionally have 1 or 2 cigarettes a week.  Has not previously used inhalers.  Does not take a daily allergy medication.  Itchy and runny eyes, states this has been ongoing for the past 3 or 4 months.  States that he has tried using warm compresses without much relief.  Past Medical History:  Diagnosis Date   Arthritis    lower back by by butt; right shoulder (12/10/2014)   Chronic back pain    Chronic sinusitis    Depression 05/31/2017   Eczema of both hands     Sometimes nickel allergy infraumbilical, flexural as well at times   Hearing loss    Hypertension 2015   Kidney stones Teen   Lithotripsy he believes on the right   Sciatica    Seasonal allergies    Tobacco abuse    Social History   Socioeconomic History   Marital status: Divorced    Spouse name: Not on file   Number of children: 2   Years of education: 11   Highest education level: Not on file  Occupational History   Occupation: counsellor cook/prep    Employer: [employer]    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  Tobacco Use   Smoking status: Every Day    Current packs/day: 0.10    Average packs/day: 0.1 packs/day for 44.0 years (4.4 ttl pk-yrs)    Types: Cigarettes   Smokeless tobacco: Never   Tobacco comments:    Has not tried anything but cold turkey  Vaping Use   Vaping status: Never Used  Substance and Sexual Activity   Alcohol use: Yes    Comment: beer occasional   Drug use: Yes    Types: Marijuana   Sexual activity: Not Currently  Other Topics Concern   Not on file  Social History Narrative   Born and raised in [place]--[place]   Finished 11th grade  at [employer]   Was living in [place], but now living in [place] around the corner   Son, [place], living with him.   Daughter is 40 yo.  She lives in [place].  They are in touch.   Jonathon Jr. Is a 6th grader at [employer].  Working with Kelley Larve, LCSWA.   Social Drivers of Health   Tobacco Use: High Risk (10/24/2024)   Patient History    Smoking Tobacco Use: Every Day    Smokeless Tobacco Use: Never    Passive Exposure: Not on file  Financial Resource Strain: Not on file  Food Insecurity: Not on file  Transportation Needs: Not on file  Physical Activity: Not on file  Stress: Not on file  Social Connections: Not on file  Intimate Partner Violence: Not on file  Depression (EYV7-0): Not on file  Alcohol Screen: Not on file  Housing:  Not on file  Utilities: Not on file  Health Literacy: Not on file   Family History  Problem Relation Age of Onset   Coronary artery disease Father    Hodgkin's lymphoma Father        Hodgkin's Lymphoma--cause of death   Coronary artery disease Mother        Died of MI   Diabetes Mother    Hypertension Mother    Hyperlipidemia Mother    Coronary artery disease Sister 69       CABG   Diabetes Sister    Hypertension Sister    Hyperlipidemia Sister    Prostate cancer Brother 50   Seizures Son        Father describes absence seizures   Diabetes Sister    Hypertension Sister    Hyperlipidemia Sister    Coronary artery disease Brother        Died following complication following CABG   Hypertension Brother    Allergies[1]  Review of Systems  Constitutional:  Negative for chills and fever.  HENT:  Negative for sinus pain and sore throat.   Eyes:  Positive for blurred vision and redness.  Respiratory:  Positive for shortness of breath. Negative for cough and sputum production.   Cardiovascular:  Negative for chest pain.  Gastrointestinal:  Negative for nausea and vomiting.  Genitourinary: Negative.   Musculoskeletal:  Positive for joint pain and myalgias.  Skin: Negative.   Neurological:  Positive for headaches. Negative for dizziness.  Endo/Heme/Allergies: Negative.   Psychiatric/Behavioral: Negative.        Objective:     BP (!) 132/98 (BP Location: Left Arm, Patient Position: Sitting)   Pulse 84   Ht 5' 6 (1.676 m)   Wt 178 lb (80.7 kg)   SpO2 98%   BMI 28.73 kg/m  BP Readings from Last 3 Encounters:  10/24/24 (!) 132/98  07/04/24 (!) 131/90  03/27/24 (!) 209/122   Wt Readings from Last 3 Encounters:  10/24/24 178 lb (80.7 kg)  07/04/24 178 lb (80.7 kg)  03/27/24 178 lb (80.7 kg)    Physical Exam Vitals and nursing note reviewed.  Constitutional:      Appearance: Normal appearance.  HENT:     Head: Normocephalic and atraumatic.     Right Ear:  External ear normal.     Left Ear: External ear normal.     Nose: Nose normal.     Mouth/Throat:     Mouth: Mucous membranes are moist.     Pharynx: Oropharynx is clear.  Eyes:     General:  Right eye: No foreign body or discharge.        Left eye: No foreign body or discharge.     Extraocular Movements:     Right eye: Normal extraocular motion.     Left eye: Normal extraocular motion.     Conjunctiva/sclera:     Right eye: Right conjunctiva is injected.     Left eye: Left conjunctiva is injected.     Pupils: Pupils are equal, round, and reactive to light.     Comments: Slight swelling noted right upper eye lid  Cardiovascular:     Rate and Rhythm: Normal rate and regular rhythm.     Pulses: Normal pulses.     Heart sounds: Normal heart sounds.  Pulmonary:     Effort: Pulmonary effort is normal.     Breath sounds: Normal breath sounds.  Musculoskeletal:        General: Normal range of motion.     Cervical back: Normal range of motion and neck supple.  Skin:    General: Skin is warm and dry.  Neurological:     General: No focal deficit present.     Mental Status: He is alert and oriented to person, place, and time.  Psychiatric:        Mood and Affect: Mood normal.        Behavior: Behavior normal.        Thought Content: Thought content normal.        Judgment: Judgment normal.        Assessment & Plan:   Problem List Items Addressed This Visit       Cardiovascular and Mediastinum   Essential hypertension - Primary   Relevant Medications   hydrochlorothiazide  (HYDRODIURIL ) 12.5 MG tablet   amLODipine  (NORVASC ) 10 MG tablet   Other Relevant Orders   CBC with Differential/Platelet   Comp. Metabolic Panel (12)   TSH   Aortic atherosclerosis   Relevant Medications   hydrochlorothiazide  (HYDRODIURIL ) 12.5 MG tablet   amLODipine  (NORVASC ) 10 MG tablet     Other   Tobacco use disorder   Relevant Medications   albuterol  (VENTOLIN  HFA) 108 (90 Base)  MCG/ACT inhaler   Other Relevant Orders   CT CHEST LUNG CA SCREEN LOW DOSE W/O CM   Other Visit Diagnoses       Left elbow pain       Relevant Medications   naproxen  (NAPROSYN ) 500 MG tablet   methylPREDNISolone  acetate (DEPO-MEDROL ) injection 80 mg (Completed)     Shortness of breath       Relevant Medications   albuterol  (VENTOLIN  HFA) 108 (90 Base) MCG/ACT inhaler     Seasonal allergies       Relevant Medications   cetirizine  (ZYRTEC  ALLERGY) 10 MG tablet     Screening, lipid       Relevant Orders   Lipid panel     Screening PSA (prostate specific antigen)       Relevant Orders   PSA      1. Essential hypertension (Primary) *** - hydrochlorothiazide  (HYDRODIURIL ) 12.5 MG tablet; Take 1 tablet (12.5 mg total) by mouth daily.  Dispense: 30 tablet; Refill: 0 - amLODipine  (NORVASC ) 10 MG tablet; Take 1 tablet (10 mg total) by mouth daily.  Dispense: 30 tablet; Refill: 0 - CBC with Differential/Platelet - Comp. Metabolic Panel (12) - TSH  2. Left elbow pain *** - naproxen  (NAPROSYN ) 500 MG tablet; Take 1 tablet (500 mg total) by mouth 2 (two) times daily with  a meal.  Dispense: 30 tablet; Refill: 0 - methylPREDNISolone  acetate (DEPO-MEDROL ) injection 80 mg  3. Shortness of breath *** - albuterol  (VENTOLIN  HFA) 108 (90 Base) MCG/ACT inhaler; Inhale 2 puffs into the lungs every 6 (six) hours as needed for wheezing or shortness of breath.  Dispense: 8 g; Refill: 0  4. Seasonal allergies *** - cetirizine  (ZYRTEC  ALLERGY) 10 MG tablet; Take 1 tablet (10 mg total) by mouth daily.  Dispense: 30 tablet; Refill: 11  5. Aortic atherosclerosis ***  6. Tobacco use disorder *** - albuterol  (VENTOLIN  HFA) 108 (90 Base) MCG/ACT inhaler; Inhale 2 puffs into the lungs every 6 (six) hours as needed for wheezing or shortness of breath.  Dispense: 8 g; Refill: 0 - CT CHEST LUNG CA SCREEN LOW DOSE W/O CM; Future  7. Screening, lipid *** - Lipid panel  8. Screening PSA (prostate  specific antigen) *** - PSA  Return in about 2 weeks (around 11/07/2024) for With [facility].    Jerriyah Louis S Mayers, PA-C      [1] No Known Allergies

## 2024-10-25 DIAGNOSIS — K760 Fatty (change of) liver, not elsewhere classified: Secondary | ICD-10-CM | POA: Insufficient documentation

## 2024-10-25 LAB — CBC WITH DIFFERENTIAL/PLATELET
Basophils Absolute: 0 x10E3/uL (ref 0.0–0.2)
Basos: 0 %
EOS (ABSOLUTE): 0.1 x10E3/uL (ref 0.0–0.4)
Eos: 2 %
Hematocrit: 47.5 % (ref 37.5–51.0)
Hemoglobin: 15.3 g/dL (ref 13.0–17.7)
Immature Grans (Abs): 0 x10E3/uL (ref 0.0–0.1)
Immature Granulocytes: 0 %
Lymphocytes Absolute: 2 x10E3/uL (ref 0.7–3.1)
Lymphs: 39 %
MCH: 28 pg (ref 26.6–33.0)
MCHC: 32.2 g/dL (ref 31.5–35.7)
MCV: 87 fL (ref 79–97)
Monocytes Absolute: 0.4 x10E3/uL (ref 0.1–0.9)
Monocytes: 8 %
Neutrophils Absolute: 2.7 x10E3/uL (ref 1.4–7.0)
Neutrophils: 51 %
Platelets: 278 x10E3/uL (ref 150–450)
RBC: 5.46 x10E6/uL (ref 4.14–5.80)
RDW: 13.7 % (ref 11.6–15.4)
WBC: 5.3 x10E3/uL (ref 3.4–10.8)

## 2024-10-25 LAB — COMP. METABOLIC PANEL (12)
AST: 27 IU/L (ref 0–40)
Albumin: 4.7 g/dL (ref 3.9–4.9)
Alkaline Phosphatase: 139 IU/L — ABNORMAL HIGH (ref 47–123)
BUN/Creatinine Ratio: 26 — ABNORMAL HIGH (ref 10–24)
BUN: 19 mg/dL (ref 8–27)
Bilirubin Total: 0.7 mg/dL (ref 0.0–1.2)
Calcium: 10.1 mg/dL (ref 8.6–10.2)
Chloride: 105 mmol/L (ref 96–106)
Creatinine, Ser: 0.72 mg/dL — ABNORMAL LOW (ref 0.76–1.27)
Globulin, Total: 2.9 g/dL (ref 1.5–4.5)
Sodium: 143 mmol/L (ref 134–144)
Total Protein: 7.6 g/dL (ref 6.0–8.5)
eGFR: 103 mL/min/1.73

## 2024-10-25 LAB — LIPID PANEL
Chol/HDL Ratio: 5.9 ratio — ABNORMAL HIGH (ref 0.0–5.0)
Cholesterol, Total: 194 mg/dL (ref 100–199)
HDL: 33 mg/dL — ABNORMAL LOW
LDL Chol Calc (NIH): 147 mg/dL — ABNORMAL HIGH (ref 0–99)
Triglycerides: 74 mg/dL (ref 0–149)
VLDL Cholesterol Cal: 14 mg/dL (ref 5–40)

## 2024-10-25 LAB — TSH: TSH: <0.005 u[IU]/mL — ABNORMAL LOW (ref 0.450–4.500)

## 2024-10-25 LAB — PSA: Prostate Specific Ag, Serum: 2.4 ng/mL (ref 0.0–4.0)

## 2024-10-28 ENCOUNTER — Ambulatory Visit: Payer: Self-pay | Admitting: Physician Assistant

## 2024-10-28 DIAGNOSIS — R748 Abnormal levels of other serum enzymes: Secondary | ICD-10-CM

## 2024-10-28 DIAGNOSIS — R7989 Other specified abnormal findings of blood chemistry: Secondary | ICD-10-CM

## 2024-10-28 NOTE — Progress Notes (Signed)
 LMOM on a private line with test results and provider recommendations. Asked for return call to clarify plan of care

## 2024-11-14 NOTE — Progress Notes (Signed)
 Pt attended mobile screening event at [facility] on 10/24/24. Pt was given a food bag and referred to [facility] on the same day for evaluation.

## 2024-11-18 NOTE — Progress Notes (Signed)
Unable to reach by phone, letter sent

## 2024-11-26 ENCOUNTER — Other Ambulatory Visit (HOSPITAL_COMMUNITY)
Admission: EM | Admit: 2024-11-26 | Discharge: 2024-11-26 | Disposition: A | Discharge status: Home / Self Care | Source: Ambulatory Visit | Attending: Physician Assistant | Admitting: Physician Assistant

## 2024-11-26 DIAGNOSIS — R748 Abnormal levels of other serum enzymes: Secondary | ICD-10-CM | POA: Diagnosis present

## 2024-11-26 LAB — COMPREHENSIVE METABOLIC PANEL WITH GFR
ALT: 111 U/L — ABNORMAL HIGH (ref 0–44)
AST: 40 U/L (ref 15–41)
Albumin: 4.6 g/dL (ref 3.5–5.0)
Alkaline Phosphatase: 130 U/L — ABNORMAL HIGH (ref 38–126)
Anion gap: 12 (ref 5–15)
BUN: 24 mg/dL — ABNORMAL HIGH (ref 8–23)
CO2: 23 mmol/L (ref 22–32)
Calcium: 10.5 mg/dL — ABNORMAL HIGH (ref 8.9–10.3)
Chloride: 108 mmol/L (ref 98–111)
Creatinine, Ser: 0.81 mg/dL (ref 0.61–1.24)
GFR, Estimated: >60 mL/min
Glucose, Bld: 104 mg/dL — ABNORMAL HIGH (ref 70–99)
Potassium: 4 mmol/L (ref 3.5–5.1)
Sodium: 143 mmol/L (ref 135–145)
Total Bilirubin: 0.6 mg/dL (ref 0.0–1.2)
Total Protein: 8.5 g/dL — ABNORMAL HIGH (ref 6.5–8.1)

## 2024-11-26 LAB — HEPATITIS PANEL, ACUTE
HCV Ab: NONREACTIVE
Hep A IgM: NONREACTIVE
Hep B C IgM: NONREACTIVE
Hepatitis B Surface Ag: NONREACTIVE

## 2024-11-27 ENCOUNTER — Ambulatory Visit: Payer: Self-pay | Admitting: Physician Assistant

## 2024-11-27 LAB — MISC LABCORP TEST (SEND OUT): Labcorp test code: 620

## 2024-12-02 NOTE — Progress Notes (Unsigned)
 The patient attended a screening event on 10/24/2024 where his BP screening results was 161/90. At the event the patient noted he has [place] Medicaid insurance and pt does smoke tobacco. At the screening event pt was given food bag and reffered to the [facility] on the same day for further evaluation of elevated blood pressure. Pt did not list pcp.   Per chart review pt pcp is Kirk GORMAN Sage, PA-C at [facility]. Patient was last seen by Kirk Sage on 10/24/2024 to establish care. Pt BP was 132/98 on 10/24/2024. According to chart review pt is currently on hydrochlorothiazide  12.5 mg and amlodipine  10 mg to manage blood pressure. At the appt on 10/24/2024 patient was encouraged to check blood pressure at home, keep a written log and have available for all office visits. Patient to follow-up with mobile unit in 2 weeks. Chart review does not indicate a future appt with pcp. No additional Health equity team support indicated at this time.  Pt declined resources and said he is good with Cari Mayers being is pcp

## 2024-12-02 NOTE — Telephone Encounter (Signed)
 Copied from CRM #[id number]. Topic: Clinical - Lab/Test Results >> Dec 02, 2024 12:14 PM Rosaria BRAVO wrote: Reason for CRM: Pt called for his lab results please advise when provider commentary is available.   Best contact: [phone]

## 2024-12-03 NOTE — Progress Notes (Signed)
 Attempted to contact pt with given number, invalid number
